# Patient Record
Sex: Male | Born: 1965 | Race: Black or African American | Hispanic: No | Smoking: Never smoker
Health system: Southern US, Community
[De-identification: ages and names within clinical notes are randomized; demographics above are authoritative.]

## PROBLEM LIST (undated history)

## (undated) DIAGNOSIS — K219 Gastro-esophageal reflux disease without esophagitis: Secondary | ICD-10-CM

## (undated) HISTORY — PX: LEG SURGERY: SHX1003

---

## 2017-07-23 ENCOUNTER — Emergency Department: Payer: Medicaid Other

## 2017-07-23 ENCOUNTER — Emergency Department
Admission: EM | Admit: 2017-07-23 | Discharge: 2017-07-24 | Disposition: A | Discharge status: Home / Self Care | Payer: Medicaid Other | Attending: Emergency Medicine | Admitting: Emergency Medicine

## 2017-07-23 ENCOUNTER — Other Ambulatory Visit: Payer: Self-pay

## 2017-07-23 DIAGNOSIS — R1011 Right upper quadrant pain: Secondary | ICD-10-CM | POA: Insufficient documentation

## 2017-07-23 DIAGNOSIS — Z202 Contact with and (suspected) exposure to infections with a predominantly sexual mode of transmission: Secondary | ICD-10-CM | POA: Diagnosis not present

## 2017-07-23 DIAGNOSIS — K2900 Acute gastritis without bleeding: Secondary | ICD-10-CM

## 2017-07-23 DIAGNOSIS — R109 Unspecified abdominal pain: Secondary | ICD-10-CM

## 2017-07-23 DIAGNOSIS — R079 Chest pain, unspecified: Secondary | ICD-10-CM | POA: Diagnosis not present

## 2017-07-23 HISTORY — DX: Gastro-esophageal reflux disease without esophagitis: K21.9

## 2017-07-23 LAB — BASIC METABOLIC PANEL
Anion gap: 11 (ref 5–15)
BUN: 12 mg/dL (ref 6–20)
CALCIUM: 9.2 mg/dL (ref 8.9–10.3)
CHLORIDE: 103 mmol/L (ref 101–111)
CO2: 25 mmol/L (ref 22–32)
CREATININE: 1.43 mg/dL — AB (ref 0.61–1.24)
GFR calc Af Amer: >60 mL/min (ref >60)
GFR calc non Af Amer: 55 mL/min — ABNORMAL LOW (ref >60)
GLUCOSE: 118 mg/dL — AB (ref 65–99)
Potassium: 3.9 mmol/L (ref 3.5–5.1)
Sodium: 139 mmol/L (ref 135–145)

## 2017-07-23 LAB — TROPONIN I: Troponin I: <0.03 ng/mL (ref <0.03)

## 2017-07-23 LAB — CBC
HCT: 49.1 % (ref 40.0–52.0)
HEMOGLOBIN: 16.2 g/dL (ref 13.0–18.0)
MCH: 28.8 pg (ref 26.0–34.0)
MCHC: 33 g/dL (ref 32.0–36.0)
MCV: 87.2 fL (ref 80.0–100.0)
PLATELETS: 187 10*3/uL (ref 150–440)
RBC: 5.63 MIL/uL (ref 4.40–5.90)
RDW: 13.3 % (ref 11.5–14.5)
WBC: 15.1 10*3/uL — ABNORMAL HIGH (ref 3.8–10.6)

## 2017-07-23 LAB — HEPATIC FUNCTION PANEL
ALT: 24 U/L (ref 17–63)
AST: 32 U/L (ref 15–41)
Albumin: 4.3 g/dL (ref 3.5–5.0)
Alkaline Phosphatase: 57 U/L (ref 38–126)
BILIRUBIN TOTAL: 0.6 mg/dL (ref 0.3–1.2)
Total Protein: 7.3 g/dL (ref 6.5–8.1)

## 2017-07-23 LAB — LIPASE, BLOOD: Lipase: 27 U/L (ref 11–51)

## 2017-07-23 MED ORDER — GI COCKTAIL ~~LOC~~
30.0000 mL | Freq: Once | ORAL | Status: AC
  Administered 2017-07-23: 30 mL via ORAL
  Filled 2017-07-23: qty 30

## 2017-07-23 MED ORDER — METRONIDAZOLE 500 MG PO TABS
2000.0000 mg | ORAL_TABLET | Freq: Once | ORAL | Status: AC
  Administered 2017-07-23: 2000 mg via ORAL
  Filled 2017-07-23: qty 4

## 2017-07-23 MED ORDER — ASPIRIN 81 MG PO CHEW
324.0000 mg | CHEWABLE_TABLET | Freq: Once | ORAL | Status: AC
  Administered 2017-07-23: 324 mg via ORAL
  Filled 2017-07-23: qty 4

## 2017-07-23 MED ORDER — AZITHROMYCIN 500 MG PO TABS
1000.0000 mg | ORAL_TABLET | Freq: Once | ORAL | Status: AC
  Administered 2017-07-23: 1000 mg via ORAL
  Filled 2017-07-23: qty 2

## 2017-07-23 NOTE — ED Notes (Signed)
Patient transported to Ultrasound 

## 2017-07-23 NOTE — ED Triage Notes (Signed)
Pt states 2 hours ago started with L sided CP that goes down to his abdomen and around to his back. States nausea but denies vomiting, SOB, diaphoresis. States hx reflux. States he wasn't doing anything when pain started. Alert, oriented.

## 2017-07-23 NOTE — Discharge Instructions (Signed)

## 2017-07-23 NOTE — ED Notes (Signed)

## 2017-07-23 NOTE — ED Notes (Signed)
Pt reports LLQ abdominal pain that started this afternoon. Pt reports it radiates into his chest. 10/10 constant pain. Last BM today and reports he has been feeling constipated. Hx acid reflux and on omeprazole. +nausea with 1 episode of vomiting. Pt also states "I was in a dirty car last week and I think I got bite by a bug on my chest." Assessed chest and 3 small raised red areas to right chest. Pt denies any itching or pain to area, no drainage noted. Pt states "I didn't feel anything bite me or see anything but I think something got me." Pt denies SOB or urinary symptoms. Call bell within reach.

## 2017-07-23 NOTE — ED Provider Notes (Signed)
[facility] Emergency Department Provider Note   ____________________________________________   First MD Initiated Contact with Patient 07/23/17 2132     (approximate)  I have reviewed the triage vital signs and the nursing notes.   HISTORY  Chief Complaint Chest Pain    HPI Eytan Lurz is a 52 y.o. male reports a history of acid reflux  Patient reports he does not have a history of heart problems.  Today he reports that he was at work, he went to [employer] and got a sandwich.  Shortly after eating that he noticed his stomach felt very upset, he began feeling nauseated having pain in his upper abdomen.  He then reports he started expensing a burning and vomited once.  After vomiting is expensive burning feeling in his mid chest.  He actually reports his symptoms have eased off quite a bit now and is feeling better.  The pain was in his upper abdomen and seem to radiate a little bit towards his back but is now gone.  No fevers or chills.  No diarrhea.  Reports he had nausea, after vomiting he felt better was left a burning in the chest.  Pain in the chest does not radiate.  Is not a heaviness or tightness, rather it feels like a asked that he flavor.  He did not take his normal acid reflux medication today because he was busy at work.  No shortness of breath.  He also reports that he had an exposure to a "dirty girl" a week ago who informed him that she had a potential STD.  He reports he is to condom and did not have any symptoms afterwards.  Reports he had gonorrhea in the past and he does not have that.  He only wants treatment in case he could have been exposed to chlamydia, but is very adamant that he would not accept treatment for gonorrhea.   Past Medical History:  Diagnosis Date  . GERD (gastroesophageal reflux disease)     There are no active problems to display for this patient.   Past Surgical History:  Procedure Laterality Date  . LEG  SURGERY      Prior to Admission medications   Not on File    Allergies Patient has no known allergies.  History reviewed. No pertinent family history.  Social History Social History   Tobacco Use  . Smoking status: Never Smoker  Substance Use Topics  . Alcohol use: Not Currently  . Drug use: Not on file    Review of Systems Constitutional: No fever/chills Eyes: No visual changes. ENT: No sore throat. Cardiovascular: See HPI respiratory: Denies shortness of breath. Gastrointestinal:  No diarrhea.  No constipation. Genitourinary: Negative for dysuria.  No discharge.  No abdominal pain lower abdomen. Musculoskeletal: Negative for back pain. Skin: Negative for rash. Neurological: Negative for headaches, focal weakness or numbness.    ____________________________________________   PHYSICAL EXAM:  VITAL SIGNS: ED Triage Vitals  Enc Vitals Group     BP 07/23/17 1822 (!) 160/93     Pulse Rate 07/23/17 1822 60     Resp 07/23/17 1822 18     Temp 07/23/17 1822 97.7 F (36.5 C)     Temp Source 07/23/17 1822 Oral     SpO2 07/23/17 1822 100 %     Weight 07/23/17 1821 225 lb (102.1 kg)     Height 07/23/17 1821 6\' 1"  (1.854 m)     Head Circumference --  Peak Flow --      Pain Score 07/23/17 1820 10     Pain Loc --      Pain Edu? --      Excl. in GC? --     Constitutional: Alert and oriented. Well appearing and in no acute distress. Eyes: Conjunctivae are normal. Head: Atraumatic. Nose: No congestion/rhinnorhea. Mouth/Throat: Mucous membranes are moist. Neck: No stridor.   Cardiovascular: Normal rate, regular rhythm. Grossly normal heart sounds.  Good peripheral circulation. Respiratory: Normal respiratory effort.  No retractions. Lungs CTAB. Gastrointestinal: Soft and nontender except for some very minimal discomfort in the epigastrium without any rebound or guarding.  Patient reports very minimal discomfort.  Reports he felt a whole lot worse earlier but felt  better after he threw up. No distention. Musculoskeletal: No lower extremity tenderness nor edema. Neurologic:  Normal speech and language. No gross focal neurologic deficits are appreciated.  Skin:  Skin is warm, dry and intact. No rash noted. Psychiatric: Mood and affect are normal. Speech and behavior are normal.  ____________________________________________   LABS (all labs ordered are listed, but only abnormal results are displayed)  Labs Reviewed  BASIC METABOLIC PANEL - Abnormal; Notable for the following components:      Result Value   Glucose, Bld 118 (*)    Creatinine, Ser 1.43 (*)    GFR calc non Af Amer 55 (*)    All other components within normal limits  CBC - Abnormal; Notable for the following components:   WBC 15.1 (*)    All other components within normal limits  HEPATIC FUNCTION PANEL - Abnormal; Notable for the following components:   Bilirubin, Direct <0.1 (*)    All other components within normal limits  TROPONIN I  LIPASE, BLOOD  TROPONIN I   ____________________________________________  EKG  Reviewed interval at 1822 Heart rate 80 QRS 99 QTc 420 Normal sinus rhythm, some slight ST abnormality noted in lead II and lead III, also some very minimal biphasic appearance in V5 and V6.  No ST elevation. Suspicious for possible LVH, but does not meet criteria. ____________________________________________  RADIOLOGY    Chest x-ray and ultrasound reviewed by me.  No acute findings. ____________________________________________   PROCEDURES  Procedure(s) performed: None  Procedures  Critical Care performed: No  ____________________________________________   INITIAL IMPRESSION / ASSESSMENT AND PLAN / ED COURSE  Pertinent labs & imaging results that were available during my care of the patient were reviewed by me and considered in my medical decision making (see chart for details).  Presents for evaluation of a burning sensation in the chest,  his symptoms have very atypical of ACS.  EKG is slightly abnormal, but in discussion with him he reports he had EKGs in the past and he reports to me that when they have occurred he is been told her just slightly abnormal as well.  Denies a history of heart disease, and I do not have any baseline EKGs to compare to any reports is not sure what hospital did them.  His first troponin is normal his x-ray is negative.  Reports his symptoms are much better, and his symptoms sound primarily like that of potential gastric distress, consider possible food poisoning, acute gastritis, no evidence of an acute abdomen.  Patient also had STD exposure, discussed with him and highly recommended treatment with Rocephin for treatment of gonorrhea, but he is adamant that he is not needing treatment for gonorrhea and only except treatment for chlamydia and other STDs.  Thus he was not given Rocephin.  ----------------------------------------- 11:40 PM on 07/23/2017 -----------------------------------------  Patient asymptomatic.  Reports no ongoing pain or discomfort.  He feels much better.  Reports after drinking the stuff given here his pain went away.  I suspect at this point no signs of ACS, 2- troponins at a very atypical history.  Suspect likely gastritis.  Patient will continue his reflux medications, start a baby aspirin daily and will follow-up closely with cardiology.  Return precautions and treatment recommendations and follow-up discussed with the patient who is agreeable with the plan.       ____________________________________________   FINAL CLINICAL IMPRESSION(S) / ED DIAGNOSES  Final diagnoses:  Abdominal pain  Chest pain with low risk for cardiac etiology  Acute gastritis without hemorrhage, unspecified gastritis type  Exposure to sexually transmitted disease (STD)      NEW MEDICATIONS STARTED DURING THIS VISIT:  New Prescriptions   No medications on file     Note:  This  document was prepared using Dragon voice recognition software and may include unintentional dictation errors.     Sharyn CreamerQuale, Prithvi Kooi, MD 07/23/17 2340

## 2017-08-23 ENCOUNTER — Emergency Department: Payer: Medicaid Other

## 2017-08-23 ENCOUNTER — Other Ambulatory Visit: Payer: Self-pay

## 2017-08-23 ENCOUNTER — Emergency Department
Admission: EM | Admit: 2017-08-23 | Discharge: 2017-08-23 | Disposition: A | Discharge status: Home / Self Care | Payer: Medicaid Other | Attending: Emergency Medicine | Admitting: Emergency Medicine

## 2017-08-23 DIAGNOSIS — N200 Calculus of kidney: Secondary | ICD-10-CM | POA: Diagnosis not present

## 2017-08-23 DIAGNOSIS — M545 Low back pain: Secondary | ICD-10-CM | POA: Diagnosis present

## 2017-08-23 LAB — BASIC METABOLIC PANEL
ANION GAP: 7 (ref 5–15)
BUN: 16 mg/dL (ref 6–20)
CO2: 28 mmol/L (ref 22–32)
CREATININE: 1.72 mg/dL — AB (ref 0.61–1.24)
Calcium: 8.9 mg/dL (ref 8.9–10.3)
Chloride: 107 mmol/L (ref 98–111)
GFR calc non Af Amer: 44 mL/min — ABNORMAL LOW (ref >60)
GFR, EST AFRICAN AMERICAN: 51 mL/min — AB (ref >60)
GLUCOSE: 101 mg/dL — AB (ref 70–99)
Potassium: 3.8 mmol/L (ref 3.5–5.1)
Sodium: 142 mmol/L (ref 135–145)

## 2017-08-23 LAB — URINALYSIS, COMPLETE (UACMP) WITH MICROSCOPIC
BACTERIA UA: NONE SEEN
Bilirubin Urine: NEGATIVE
Glucose, UA: NEGATIVE mg/dL
Ketones, ur: NEGATIVE mg/dL
Leukocytes, UA: NEGATIVE
Nitrite: NEGATIVE
PROTEIN: 30 mg/dL — AB
RBC / HPF: >50 RBC/hpf — ABNORMAL HIGH (ref 0–5)
Specific Gravity, Urine: 1.021 (ref 1.005–1.030)
pH: 5 (ref 5.0–8.0)

## 2017-08-23 LAB — CBC
HEMATOCRIT: 47.1 % (ref 40.0–52.0)
Hemoglobin: 16 g/dL (ref 13.0–18.0)
MCH: 29.2 pg (ref 26.0–34.0)
MCHC: 33.9 g/dL (ref 32.0–36.0)
MCV: 86.2 fL (ref 80.0–100.0)
PLATELETS: 187 10*3/uL (ref 150–440)
RBC: 5.47 MIL/uL (ref 4.40–5.90)
RDW: 13 % (ref 11.5–14.5)
WBC: 9.5 10*3/uL (ref 3.8–10.6)

## 2017-08-23 LAB — TROPONIN I: Troponin I: <0.03 ng/mL (ref <0.03)

## 2017-08-23 MED ORDER — OXYCODONE-ACETAMINOPHEN 5-325 MG PO TABS: 1.0000 | ORAL_TABLET | ORAL | 0 refills | Status: DC | PRN

## 2017-08-23 MED ORDER — TAMSULOSIN HCL 0.4 MG PO CAPS: 0.4000 mg | ORAL_CAPSULE | Freq: Every day | ORAL | 0 refills | Status: DC

## 2017-08-23 MED ORDER — KETOROLAC TROMETHAMINE 60 MG/2ML IM SOLN
60.0000 mg | Freq: Once | INTRAMUSCULAR | Status: AC
  Administered 2017-08-23: 60 mg via INTRAMUSCULAR
  Filled 2017-08-23: qty 2

## 2017-08-23 NOTE — ED Notes (Signed)
Patient transported to CT 

## 2017-08-23 NOTE — ED Notes (Signed)
ED Provider at bedside. 

## 2017-08-23 NOTE — ED Notes (Signed)
No peripheral IV placed this visit.   Discharge instructions reviewed with patient. Questions fielded by this RN. Patient verbalizes understanding of instructions. Patient discharged home in stable condition per Dr Paduchowski. No acute distress noted at time of discharge.   

## 2017-08-23 NOTE — ED Triage Notes (Signed)
Pt states left mid back pain that wraps around to left chest. Pt denies shob, nausea, dizziness, light headed. Pt states strong sensation of needing to urinate.

## 2017-08-23 NOTE — ED Provider Notes (Signed)
[facility] Emergency Department Provider Note  Time seen: 3:15 AM  I have reviewed the triage vital signs and the nursing notes.   HISTORY  Chief Complaint Back Pain    HPI Juan [name] is a 52 y.o. male with a past medical history of gastric reflux presents to the emergency department for left back pain.  According to the patient for the past 6 or 7 hours she has been experiencing fairly significant 8/10 sharp pain in his left back wrapping around to his abdomen.  Denies any chest pain or shortness of breath.  Denies any nausea or vomiting or diarrhea.  Does state an urge to urinate and thought he saw a pink discoloration to the urine earlier.  No history of kidney stones.  No family history of kidney stones.  Describes his pain as 8/10 currently.   Past Medical History:  Diagnosis Date  . GERD (gastroesophageal reflux disease)     There are no active problems to display for this patient.   Past Surgical History:  Procedure Laterality Date  . LEG SURGERY      Prior to Admission medications   Not on File    No Known Allergies  No family history on file.  Social History Social History   Tobacco Use  . Smoking status: Never Smoker  Substance Use Topics  . Alcohol use: Not Currently  . Drug use: Not on file    Review of Systems Constitutional: Negative for fever. Cardiovascular: Negative for chest pain. Respiratory: Negative for shortness of breath. Gastrointestinal: Negative for abdominal pain, vomiting and diarrhea. Genitourinary: Positive for urinary frequency and possible blood in the urine. Musculoskeletal: Left back pain Skin: Negative for skin complaints  Neurological: Negative for headache All other ROS negative  ____________________________________________   PHYSICAL EXAM:  VITAL SIGNS: ED Triage Vitals  Enc Vitals Group     BP 08/23/17 0141 (!) 157/89     Pulse Rate 08/23/17 0141 72     Resp 08/23/17 0141 16     Temp  08/23/17 0141 97.9 F (36.6 C)     Temp Source 08/23/17 0141 Oral     SpO2 08/23/17 0141 98 %     Weight 08/23/17 0142 215 lb (97.5 kg)     Height 08/23/17 0142 6\' 1"  (1.854 m)     Head Circumference --      Peak Flow --      Pain Score 08/23/17 0141 10     Pain Loc --      Pain Edu? --      Excl. in GC? --    Constitutional: Alert and oriented. Well appearing and in no distress. Eyes: Normal exam ENT   Head: Normocephalic and atraumatic.   Mouth/Throat: Mucous membranes are moist. Cardiovascular: Normal rate, regular rhythm. No murmur Respiratory: Normal respiratory effort without tachypnea nor retractions. Breath sounds are clear Gastrointestinal: Soft and nontender. No distention.  Mild left CVA tenderness. Musculoskeletal: Nontender with normal range of motion in all extremities.  Neurologic:  Normal speech and language. No gross focal neurologic deficits  Skin:  Skin is warm, dry and intact.  Psychiatric: Mood and affect are normal.   ____________________________________________    EKG  EKG reviewed and interpreted by myself shows normal sinus rhythm at 69 bpm with a narrow QRS, normal axis, normal intervals, no concerning ST changes.  ____________________________________________    RADIOLOGY  Chest x-ray negative CT scan shows 7 mm stone in the distal left ureter. ____________________________________________  INITIAL IMPRESSION / ASSESSMENT AND PLAN / ED COURSE  Pertinent labs & imaging results that were available during my care of the patient were reviewed by me and considered in my medical decision making (see chart for details).  Patient presents to the emergency department for left back pain radiating to the front of the abdomen also with urinary frequency.  Differential would include ureterolithiasis, muscular skeletal pain, colitis or diverticulitis, urinary tract infection or pyelonephritis.  Patient's basic lab work is largely at baseline, mild  renal insufficiency although largely unchanged from prior.  Will obtain CT imaging as well as a urinalysis to further evaluate.  Patient agreeable to this plan of care.  CT scan shows 7 mm stone in the distal left ureter.  No white blood cell count elevation.  Slight elevation in baseline creatinine.  Urinalysis does not appear consistent with any urinary tract infection.  We will discharge the patient with prompt urology follow-up.  Patient agreeable to plan of care.  We will place on Percocet and Flomax.  I discussed return precautions for worsening pain, dysuria, or fever. ____________________________________________   FINAL CLINICAL IMPRESSION(S) / ED DIAGNOSES  Left flank pain Kidney stone   Minna Antis, MD 08/23/17 [phone]

## 2017-08-23 NOTE — Discharge Instructions (Signed)
Your work-up shows a large 7 mm kidney stone on the left side.  These follow-up with urology by calling on Monday for the next available appointment.  Please take your medications as prescribed.  Drink plenty of fluids.  Return to the emergency department for any worsening pain, painful urination or fever.

## 2017-08-24 LAB — URINE CULTURE: Culture: NO GROWTH

## 2017-10-08 ENCOUNTER — Encounter: Payer: Self-pay | Admitting: Emergency Medicine

## 2017-10-08 ENCOUNTER — Emergency Department
Admission: EM | Admit: 2017-10-08 | Discharge: 2017-10-08 | Disposition: A | Discharge status: Home / Self Care | Payer: Medicaid Other | Attending: Emergency Medicine | Admitting: Emergency Medicine

## 2017-10-08 DIAGNOSIS — N23 Unspecified renal colic: Secondary | ICD-10-CM | POA: Insufficient documentation

## 2017-10-08 DIAGNOSIS — N289 Disorder of kidney and ureter, unspecified: Secondary | ICD-10-CM

## 2017-10-08 DIAGNOSIS — R109 Unspecified abdominal pain: Secondary | ICD-10-CM | POA: Diagnosis present

## 2017-10-08 LAB — URINALYSIS, COMPLETE (UACMP) WITH MICROSCOPIC
Bacteria, UA: NONE SEEN
Bilirubin Urine: NEGATIVE
GLUCOSE, UA: NEGATIVE mg/dL
Ketones, ur: NEGATIVE mg/dL
Leukocytes, UA: NEGATIVE
NITRITE: NEGATIVE
PROTEIN: NEGATIVE mg/dL
SPECIFIC GRAVITY, URINE: 1.016 (ref 1.005–1.030)
Squamous Epithelial / LPF: NONE SEEN (ref 0–5)
pH: 7 (ref 5.0–8.0)

## 2017-10-08 LAB — BASIC METABOLIC PANEL
Anion gap: 8 (ref 5–15)
BUN: 16 mg/dL (ref 6–20)
CALCIUM: 9 mg/dL (ref 8.9–10.3)
CO2: 27 mmol/L (ref 22–32)
CREATININE: 1.86 mg/dL — AB (ref 0.61–1.24)
Chloride: 106 mmol/L (ref 98–111)
GFR, EST AFRICAN AMERICAN: 46 mL/min — AB (ref >60)
GFR, EST NON AFRICAN AMERICAN: 40 mL/min — AB (ref >60)
Glucose, Bld: 106 mg/dL — ABNORMAL HIGH (ref 70–99)
Potassium: 3.9 mmol/L (ref 3.5–5.1)
SODIUM: 141 mmol/L (ref 135–145)

## 2017-10-08 LAB — CBC
HCT: 47 % (ref 40.0–52.0)
Hemoglobin: 16.1 g/dL (ref 13.0–18.0)
MCH: 29.5 pg (ref 26.0–34.0)
MCHC: 34.3 g/dL (ref 32.0–36.0)
MCV: 86.2 fL (ref 80.0–100.0)
PLATELETS: 188 10*3/uL (ref 150–440)
RBC: 5.45 MIL/uL (ref 4.40–5.90)
RDW: 13.1 % (ref 11.5–14.5)
WBC: 9.1 10*3/uL (ref 3.8–10.6)

## 2017-10-08 MED ORDER — OXYCODONE-ACETAMINOPHEN 5-325 MG PO TABS: 1.0000 | ORAL_TABLET | ORAL | 0 refills | Status: AC | PRN

## 2017-10-08 MED ORDER — SODIUM CHLORIDE 0.9 % IV BOLUS
1000.0000 mL | Freq: Once | INTRAVENOUS | Status: AC
  Administered 2017-10-08: 1000 mL via INTRAVENOUS

## 2017-10-08 MED ORDER — HYDROMORPHONE HCL 1 MG/ML IJ SOLN
0.5000 mg | Freq: Once | INTRAMUSCULAR | Status: AC
  Administered 2017-10-08: 0.5 mg via INTRAVENOUS
  Filled 2017-10-08: qty 1

## 2017-10-08 MED ORDER — OXYCODONE-ACETAMINOPHEN 5-325 MG PO TABS
1.0000 | ORAL_TABLET | Freq: Once | ORAL | Status: AC
  Administered 2017-10-08: 1 via ORAL
  Filled 2017-10-08: qty 1

## 2017-10-08 NOTE — Discharge Instructions (Addendum)
Please drink plenty fluid to stay well-hydrated.  You may Tylenol for mild to moderate pain and Percocet as needed for breakthrough pain.  Do not drive within 8 hours of taking Percocet.  Stop taking Toradol, and do not take any other NSAID medications including Advil, Aleve, ibuprofen or Motrin as your kidney function is slightly more abnormal today and these medications can worsen your kidney function.  Return to the emergency department if you develop severe pain, fever, inability to keep down fluids, or any other symptoms concerning to you.

## 2017-10-08 NOTE — ED Notes (Signed)
NAD noted at time of D/C. Pt denies questions or concerns. Pt ambulatory to the lobby at this time.  

## 2017-10-08 NOTE — ED Provider Notes (Addendum)
[facility] Emergency Department Provider Note  ____________________________________________  Time seen: Approximately 7:57 AM  I have reviewed the triage vital signs and the nursing notes.   HISTORY  Chief Complaint Nephrolithiasis and Flank Pain    HPI Juan [name] is a 52 y.o. male with a known 7 mm left-sided ureteral stone [facility] presenting for left flank pain.  The patient reports "I did not sleep all night."  He describes a pain in the left flank that does not radiate without any associated fevers, nausea or vomiting.  He tried a Toradol at 10 PM which did not help.  Prior, his been successfully treated with oxycodone but he does not have any left.  Past Medical History:  Diagnosis Date  . GERD (gastroesophageal reflux disease)     There are no active problems to display for this patient.   Past Surgical History:  Procedure Laterality Date  . LEG SURGERY      Current Outpatient Rx  . Order #: [id number] Class: Print  . Order #: [id number] Class: Print    Allergies Patient has no known allergies.  No family history on file.  Social History Social History   Tobacco Use  . Smoking status: Never Smoker  Substance Use Topics  . Alcohol use: Not Currently  . Drug use: Not on file    Review of Systems Constitutional: No fever/chills.  No lightheadedness or syncope. Eyes: No visual changes. ENT: No sore throat. No congestion or rhinorrhea. Cardiovascular: Denies chest pain. Denies palpitations. Respiratory: Denies shortness of breath.  No cough. Gastrointestinal: No abdominal pain.  Positive left flank pain.  No nausea, no vomiting.  No diarrhea.  No constipation. Genitourinary: Negative for dysuria.  No hematuria.  No penile or testicular pain. Musculoskeletal: Negative for back pain, except for left flank pain.. Skin: Negative for rash. Neurological: Negative for headaches. No focal numbness,  tingling or weakness.     ____________________________________________   PHYSICAL EXAM:  VITAL SIGNS: ED Triage Vitals  Enc Vitals Group     BP 10/08/17 0738 (!) 145/101     Pulse Rate 10/08/17 0738 68     Resp 10/08/17 0738 20     Temp 10/08/17 0738 97.8 F (36.6 C)     Temp Source 10/08/17 0738 Oral     SpO2 10/08/17 0738 94 %     Weight 10/08/17 0740 215 lb (97.5 kg)     Height 10/08/17 0740 6' (1.829 m)     Head Circumference --      Peak Flow --      Pain Score 10/08/17 0740 10     Pain Loc --      Pain Edu? --      Excl. in GC? --     Constitutional: Alert and oriented. Answers questions appropriately.  Sitting comfortably on the stretcher. Eyes: Conjunctivae are normal.  EOMI. No scleral icterus. Head: Atraumatic. Nose: No congestion/rhinnorhea. Mouth/Throat: Mucous membranes are moist.  Neck: No stridor.  Supple.   Cardiovascular: Normal rate, regular rhythm. No murmurs, rubs or gallops.  Respiratory: Normal respiratory effort.  No accessory muscle use or retractions. Lungs CTAB.  No wheezes, rales or ronchi. Gastrointestinal: Soft, nontender and nondistended.  Positive mild left CVA tenderness to palpation.  No CVA tenderness to palpation on the right.  No guarding or rebound.  No peritoneal signs.  Musculoskeletal: No LE edema. Neurologic:  A&Ox3.  Speech is clear.  Face and smile are symmetric.  EOMI.  Moves all extremities well. Skin:  Skin is warm, dry and intact. No rash noted. Psychiatric: Mood and affect are normal. Speech and behavior are normal.  Normal judgement.  ____________________________________________   LABS (all labs ordered are listed, but only abnormal results are displayed)  Labs Reviewed  CBC  BASIC METABOLIC PANEL  URINALYSIS, COMPLETE (UACMP) WITH MICROSCOPIC   ____________________________________________  EKG  Not indicated ____________________________________________  RADIOLOGY  No results  found.  ____________________________________________   PROCEDURES  Procedure(s) performed: None  Procedures  Critical Care performed: No ____________________________________________   INITIAL IMPRESSION / ASSESSMENT AND PLAN / ED COURSE  Pertinent labs & imaging results that were available during my care of the patient were reviewed by me and considered in my medical decision making (see chart for details).  52 y.o. male with a known left ureteral stone presenting with uncontrolled pain at home.  Overall, the patient is hemodynamically stable and does not have any signs or symptoms of UTI or sepsis but we will get a urinalysis.  In addition I will check his creatinine today.  He is already scheduled for [facility] his [facility] tomorrow [facility], which is a reasonable plan if there are no other abnormalities on his laboratory studies today.  Another etiology for the patient's pain is possible but much less likely; aortic pathology or GI pathology including diverticulitis are considered but very unlikely. We will give him intravenous fluids and pain control, and reevaluate for final disposition.  ____________________________________________  FINAL CLINICAL IMPRESSION(S) / ED DIAGNOSES  Final diagnoses:  Renal colic on left side    Clinical Course as of Oct 23 1533  Wed Oct 08, 2017  [id number] The patient states that his pain has not improved at all with Percocet.  I will give him an IV dose of Dilaudid for pain.  At this time, the patient is going to provide a urine sample.  He does have a mildly increased creatinine at 1.86 from his baseline so I am hesitant to give him additional topical Toradol.  I have explained his creatinine result to him, and will make sure that he is drinking plenty of fluids at home, avoiding Toradol, and will follow up for his regularly scheduled surgery tomorrow.  Plan discharge after urinalysis.   [AN]  [id number] Patient is feeling significantly better at this  time.  I will plan to discharge him home with a prescription for 6 tablets of Percocet to use for breakthrough pain with instructions to use Tylenol for mild to moderate pain.  I have asked him to avoid NSAID medications as his creatinine is slightly bumped today.  He understands return precautions as well as follow-up instructions   [AN]    Clinical Course User Index [AN] Rockne MenghiniNorman, Anne-Caroline, MD      NEW MEDICATIONS STARTED DURING THIS VISIT:  New Prescriptions   No medications on file      Rockne MenghiniNorman, Anne-Caroline, MD 10/08/17 [id number]    Rockne MenghiniNorman, Anne-Caroline, MD 10/23/17 1535

## 2017-10-08 NOTE — ED Notes (Signed)
Pt given urinal at this time to provide UA.

## 2017-10-08 NOTE — ED Triage Notes (Signed)
Pt reports has a kidney stone that is too big to pass and he is scheduled for surgery for it tomorrow but can not take the pain.

## 2017-10-08 NOTE — ED Notes (Signed)
First Nurse Note: Patient scheduled for surgery tomorrow, here with worsening pain.  States he knows he has kidney stones.

## 2018-11-10 IMAGING — CT CT RENAL STONE PROTOCOL
2 of 4 series · 16 of 46 positions shown, 18 images · non-contrast
Comparison: Ultrasound right upper quadrant 07/23/2017

CLINICAL DATA: Left mid back pain.  Urgency to urinate.

EXAM:
CT ABDOMEN AND PELVIS WITHOUT CONTRAST
TECHNIQUE: Multidetector CT imaging of the abdomen and pelvis was performed
following the standard protocol without IV contrast.

[Series 2: stone full standard · axial · 0.77mm/px · z∈[-911,-486]mm · 13 of 93 slices shown, 15 images]
[im 4/93  soft-tissue]
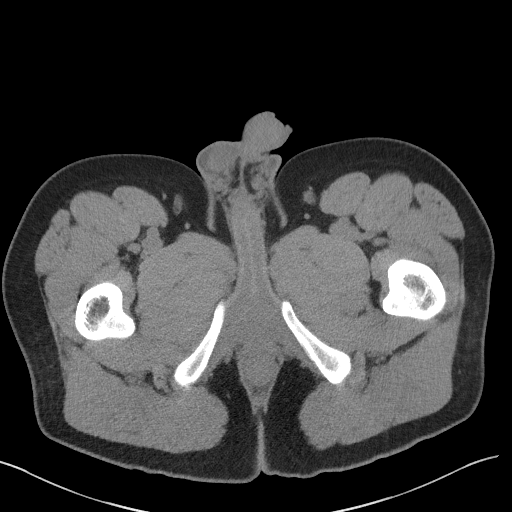
[im 4/93  bone]
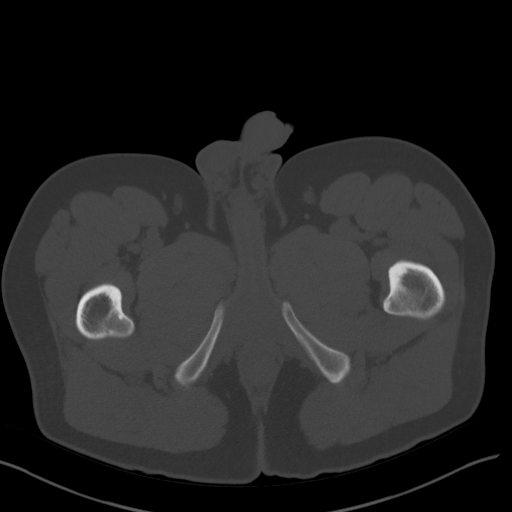
[im 12/93  soft-tissue]
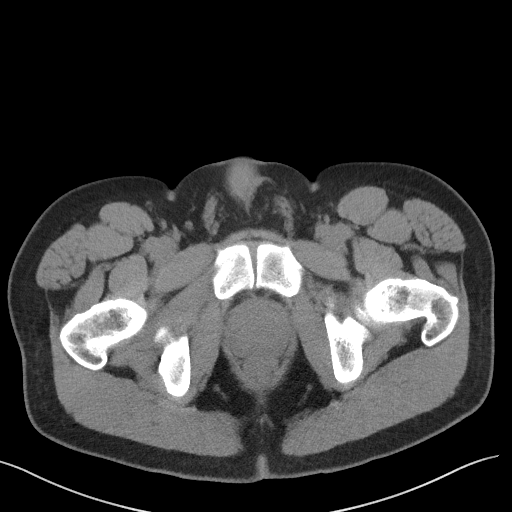
[im 20/93  soft-tissue]
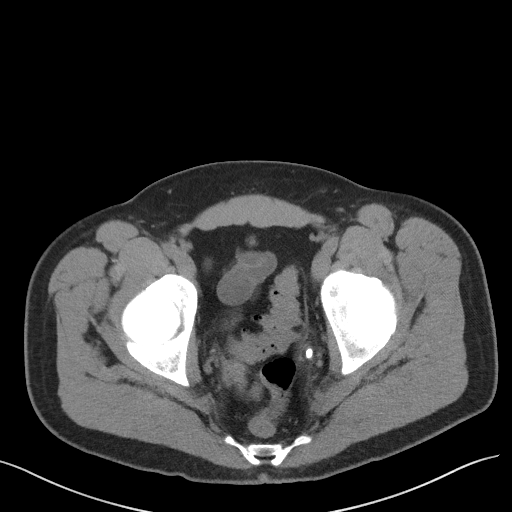
[im 27/93  soft-tissue]
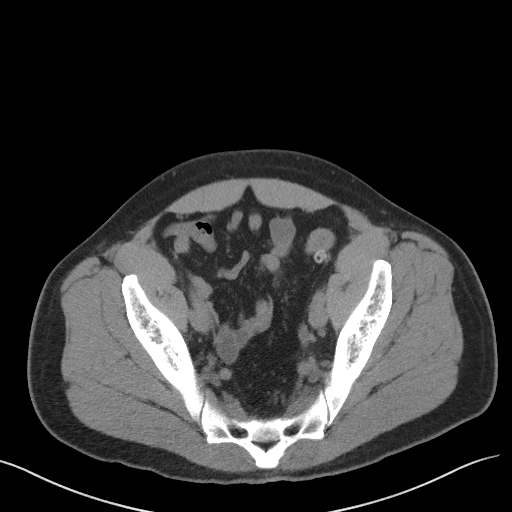
[im 31/93  soft-tissue]
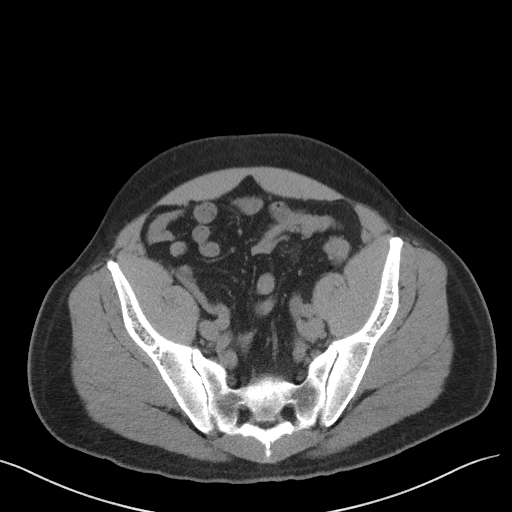
[im 39/93  soft-tissue]
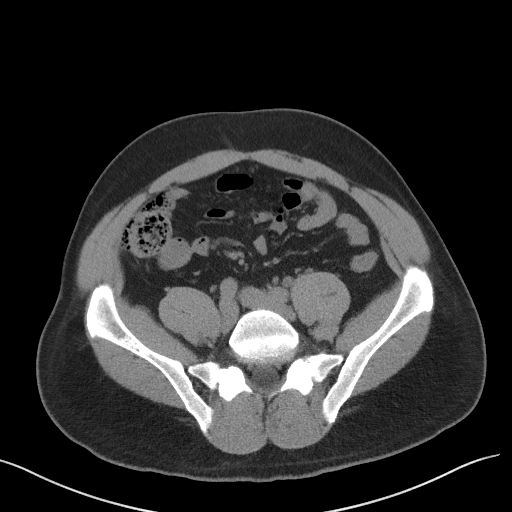
[im 47/93  soft-tissue]
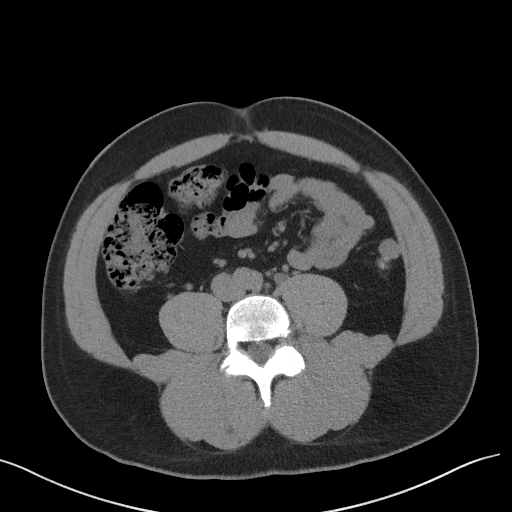
[im 54/93  soft-tissue]
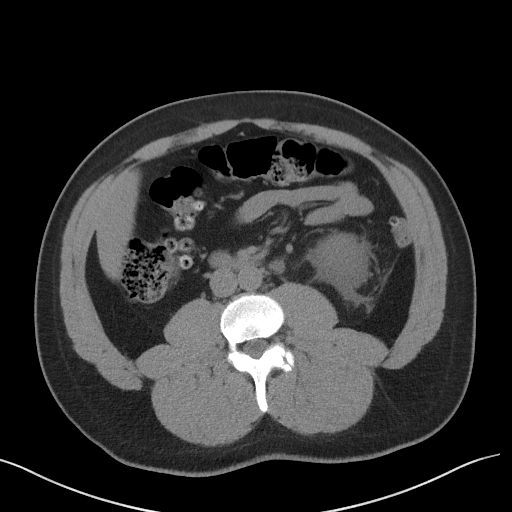
[im 62/93  soft-tissue]
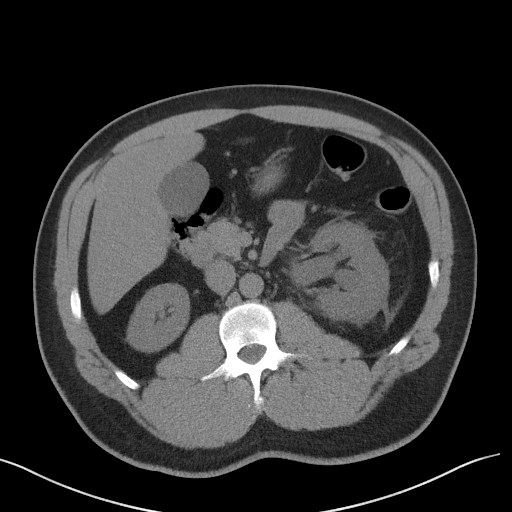
[im 62/93  bone]
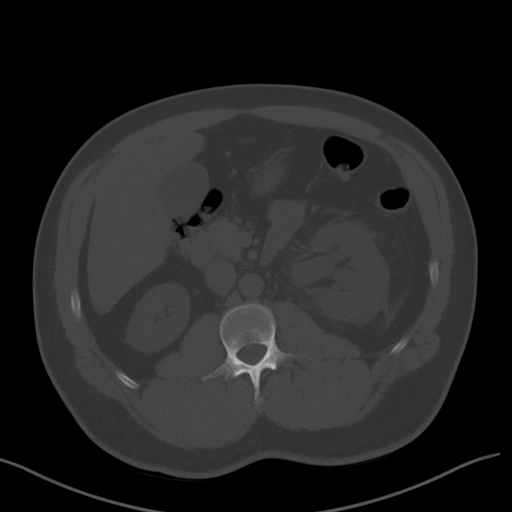
[im 66/93  soft-tissue]
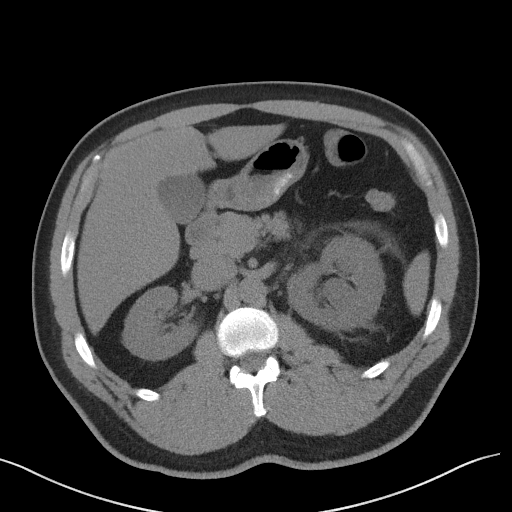
[im 73/93  soft-tissue]
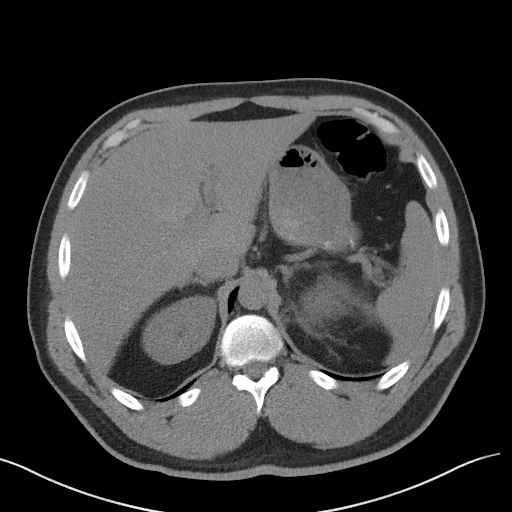
[im 81/93  soft-tissue]
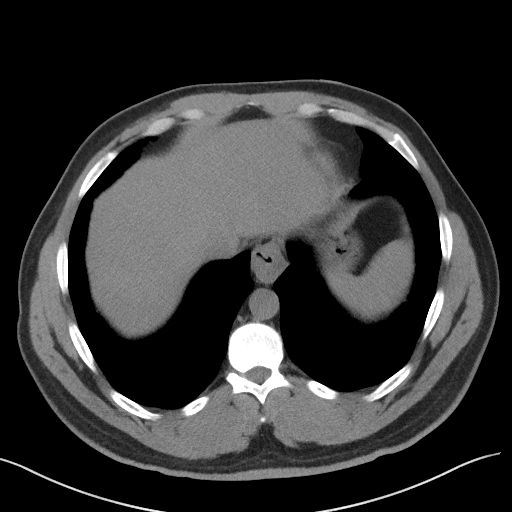
[im 89/93  soft-tissue]
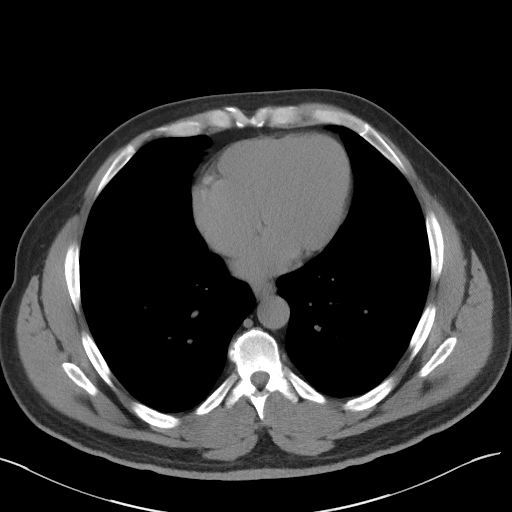

[Series 5: coronal · coronal · 0.74mm/px · 3 of 138 slices shown]
[im 46/138  soft-tissue]
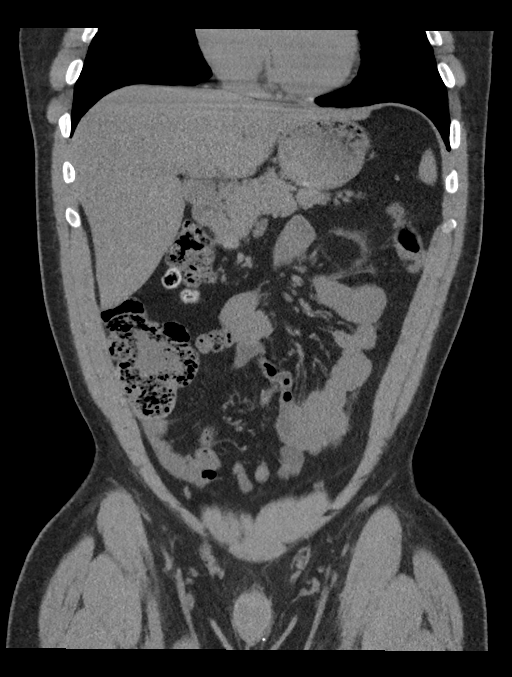
[im 61/138  soft-tissue]
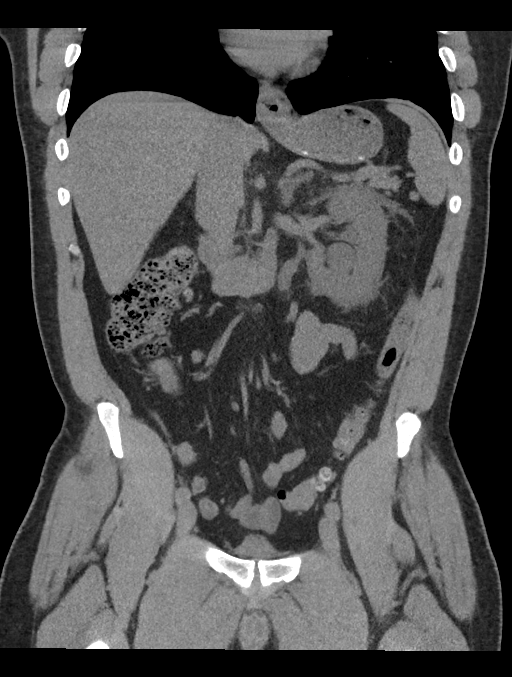
[im 77/138  soft-tissue]
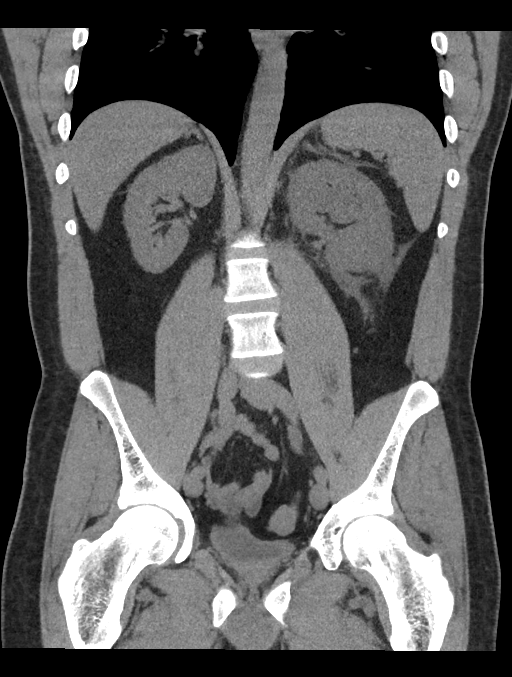

[16 of 46 positions shown; findings below may reference images not displayed]

FINDINGS: Lower chest: Lung bases are clear.  Small esophageal hiatal hernia.

Hepatobiliary: No focal liver abnormality is seen. No gallstones,
gallbladder wall thickening, or biliary dilatation.

Pancreas: Unremarkable. No pancreatic ductal dilatation or
surrounding inflammatory changes.

Spleen: Normal in size without focal abnormality.

Adrenals/Urinary Tract: There is a 7 mm stone in the distal left
ureter at the level of the acetabulum. There is proximal
hydronephrosis and hydroureter with stranding around the left kidney
and ureter. No additional stones are demonstrated. Right kidney and
ureter are decompressed. Bladder wall is not thickened. No bladder
stones identified.

Stomach/Bowel: Diverticulosis of the sigmoid colon without
inflammatory changes. Stomach, small bowel, and colon are not
abnormally distended. No wall thickening is appreciated although
under distention limits examination of the wall. Appendix is normal.

Vascular/Lymphatic: No significant vascular findings are present. No
enlarged abdominal or pelvic lymph nodes.

Reproductive: Prostate gland is enlarged, measuring 4.6 cm in
diameter.

Other: No abdominal wall hernia or abnormality. No abdominopelvic
ascites.

Musculoskeletal: No acute or significant osseous findings.
IMPRESSION: 1. 7 mm stone in the distal left ureter with moderate proximal
obstruction.
2. Enlarged prostate gland.
3. Small esophageal hiatal hernia.
4. Diverticulosis of sigmoid colon without evidence of
diverticulitis.

## 2019-04-09 ENCOUNTER — Ambulatory Visit: Payer: Medicaid Other | Admitting: Internal Medicine

## 2019-04-27 ENCOUNTER — Encounter: Payer: Self-pay | Admitting: Internal Medicine

## 2019-04-27 ENCOUNTER — Other Ambulatory Visit: Payer: Self-pay

## 2019-04-27 ENCOUNTER — Ambulatory Visit: Payer: Medicaid Other | Attending: Internal Medicine | Admitting: Internal Medicine

## 2019-04-27 VITALS — Ht 73.0 in

## 2019-04-27 DIAGNOSIS — M79605 Pain in left leg: Secondary | ICD-10-CM | POA: Insufficient documentation

## 2019-04-27 DIAGNOSIS — S8412XS Injury of peroneal nerve at lower leg level, left leg, sequela: Secondary | ICD-10-CM

## 2019-04-27 DIAGNOSIS — S8412XA Injury of peroneal nerve at lower leg level, left leg, initial encounter: Secondary | ICD-10-CM | POA: Insufficient documentation

## 2019-04-27 DIAGNOSIS — G4489 Other headache syndrome: Secondary | ICD-10-CM | POA: Diagnosis not present

## 2019-04-27 DIAGNOSIS — N529 Male erectile dysfunction, unspecified: Secondary | ICD-10-CM | POA: Diagnosis not present

## 2019-04-27 DIAGNOSIS — Z79891 Long term (current) use of opiate analgesic: Secondary | ICD-10-CM

## 2019-04-27 DIAGNOSIS — K219 Gastro-esophageal reflux disease without esophagitis: Secondary | ICD-10-CM | POA: Diagnosis not present

## 2019-04-27 NOTE — Progress Notes (Signed)
Virtual Visit via Telephone Note Due to current restrictions/limitations of in-office visits due to the COVID-19 pandemic, this scheduled clinical appointment was converted to a telehealth visit. Patient requested tele-visit rather than in person  I connected with Juan [name] on 04/27/19 at 8:39 a.m by telephone and verified that I am speaking with the correct person using two identifiers. I am in my office.  The patient is at home.  Only the patient and myself participated in this encounter.  I discussed the limitations, risks, security and privacy concerns of performing an evaluation and management service by telephone and the availability of in person appointments. I also discussed with the patient that there may be a patient responsible charge related to this service. The patient expressed understanding and agreed to proceed.   History of Present Illness: Pt for new pt visit. Previous PCP was at [facility] in [place].  Last seen 2 mths ago.   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  Pt is on Oxycodone, Cialis 20 mg and Omeprazole.  Main concern today is HA.  Started a 1 mth ago "off and on.  Then I started waking up with a headache."   -frequency:  Once a wk -location: behind the eyes and temple area -Associated symptoms:  No new blurred vision, N/V, dizziness, photophobia.  Last 2 days.  No initiating factors.  Wakes up with HA.  Does not snore.  Wakes up feeling refresh sometimes. No previous hx of HA.  No personal HX of HTN Nothing makes it worse Better with putting hot rag on his head and taking 3 Excedrine pills.  Took 1 hr to kick in.  Reports he would also take one Oxycodone.  -wears reading glasses.  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  He reports chronic nerve pain.  He tells me he has been on oxycodone for the past 2 years.  However in reviewing his  records on [facility], and [facility], this is not the case.  Recently given a prescription in January for 60 tablets by Dr. Sela Hua from [place].  Urine drug screen was negative at that time.  However prior to that last prescription was in 2019 not on a consistent basis.  Informed patient of this information at which point he admitted that he was not on oxycodone consistently for the past 2 years as he had indicated earlier.  Observations/Objective: No direct observation done as this was a telephone encounter.  However I did review some of his records from [facility] through [facility]  Assessment and Plan: 1. Headache syndrome Does not sound typical for migraine or cluster.  Will refer to neurology for further evaluation.  In the meantime I recommend he can continue using the Excedrin since he does get relief with that. - Ambulatory referral to Neurology  2. Injury of left peroneal nerve, sequela Recommend trying him with gabapentin but patient states he was on that in the past and did not find it helpful.  Advised that we do not prescribe oxycodone at our practice.  He is willing to see a pain specialist - Ambulatory referral to Pain Clinic  3. Pain of left lower extremity - Ambulatory referral to Pain Clinic  4. Erectile dysfunction, unspecified erectile dysfunction type On Cialis  5. Gastroesophageal reflux disease without esophagitis On omeprazole   Follow Up Instructions: 2 mths in-person  I discussed the assessment and treatment plan with the patient. The patient was provided an opportunity to ask questions and all were answered. The patient agreed with the plan and demonstrated an understanding of the instructions.   The patient was advised to call back or seek an in-person evaluation if the symptoms worsen or if the condition fails to improve as anticipated.  I provided 15 minutes of non-face-to-face time during this encounter.   Karle Plumber, MD

## 2019-04-27 NOTE — Progress Notes (Signed)
Pt states he is also having pain in his left leg

## 2019-05-03 ENCOUNTER — Encounter: Payer: Self-pay | Admitting: Physical Medicine & Rehabilitation

## 2019-05-18 ENCOUNTER — Other Ambulatory Visit: Payer: Self-pay

## 2019-05-18 ENCOUNTER — Encounter: Payer: Self-pay | Admitting: Physical Medicine & Rehabilitation

## 2019-05-18 ENCOUNTER — Encounter: Payer: Medicaid Other | Attending: Physical Medicine & Rehabilitation | Admitting: Physical Medicine & Rehabilitation

## 2019-05-18 VITALS — Temp 97.7°F | Ht 73.0 in | Wt 225.0 lb

## 2019-05-18 DIAGNOSIS — S8412XA Injury of peroneal nerve at lower leg level, left leg, initial encounter: Secondary | ICD-10-CM | POA: Insufficient documentation

## 2019-05-18 NOTE — Patient Instructions (Addendum)
If urine screen looks ok would do combination therapy Pregabalin plus  tramadol    TENS unit rep will schedule a meeting with you in this office   Please notify me if you'd like to try a new foot brace left side

## 2019-05-18 NOTE — Progress Notes (Signed)
Subjective:    Patient ID: Juan [name], male    DOB: 23-Jun-1965, 54 y.o.   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  Had immediate onset of Left foot drop.  Has had 2 foot surgeries ~22mo after the fall.  Immediate major swelling C/o toes clawing, drags the left foot.  Has been wearing a cam walker boot.  He indicates he had some other type of brace but this was uncomfortable.  Does not see podiatrist.  Had benefit from TENS unit in the past but no longer has a unit. He is independent with all his self-care and mobility. His pain is mainly in the foot but he cannot tell me whether that is the top of the foot or the bottom of the foot.  No pain with movement of the foot but does have pain when weightbearing.  The patient uses a cane to ambulate due to instability No recent falls or trauma to the left foot. The patient has not noticed any pain in his low back or in his upper leg.  No other weakness in his body other than at the left foot and ankle area.  He has had some pain in the left arm but this is not a major complaint.  PDQ 9 score is 4 low Opioid risk score 0, low Pain Inventory Average Pain 9 Pain Right Now 10 My pain is sharp, burning, stabbing, tingling and aching  In the last 24 hours, has pain interfered with the following? General activity 9 Relation with others 9 Enjoyment of life 9 What TIME of day is your pain at its worst? night Sleep (in general) Poor  Pain is worse with: walking and some activites Pain improves with: medication Relief from Meds: 10  Mobility walk with assistance use a cane ability to climb steps?  yes do you drive?  yes  Function disabled: date disabled .  Neuro/Psych numbness tingling trouble walking spasms  Prior Studies new  Physicians involved in your care new   History reviewed. No pertinent family history. Social History   Socioeconomic History  . Marital status: Divorced   Spouse name: Not on file  . Number of children: Not on file  . Years of education: Not on file  . Highest education level: Not on file  Occupational History  . Not on file  Tobacco Use  . Smoking status: Never Smoker  . Smokeless tobacco: Never Used  Substance and Sexual Activity  . Alcohol use: Not Currently  . Drug use: Never  . Sexual activity: Not on file  Other Topics Concern  . Not on file  Social History Narrative  . Not on file   Social Determinants of Health   Financial Resource Strain:   . Difficulty of Paying Living Expenses:   Food Insecurity:   . Worried About Charity fundraiser in the Last Year:   . Arboriculturist in the Last Year:   Transportation Needs:   . Film/video editor (Medical):   Marland Kitchen Lack of Transportation (Non-Medical):   Physical Activity:   . Days of Exercise per Week:   . Minutes of Exercise per Session:   Stress:   . Feeling of Stress :   Social Connections:   . Frequency of Communication with Friends and Family:   . Frequency of Social Gatherings with Friends and Family:   . Attends Religious Services:   . Active Member of Clubs or  Organizations:   . Attends Banker Meetings:   Marland Kitchen Marital Status:    Past Surgical History:  Procedure Laterality Date  . LEG SURGERY     Past Medical History:  Diagnosis Date  . GERD (gastroesophageal reflux disease)    Temp 97.7 F (36.5 C)   Ht 6\' 1"  (1.854 m)   Wt 225 lb (102.1 kg)   BMI 29.69 kg/m   Opioid Risk Score:   Fall Risk Score:  `1  Depression screen PHQ 2/9  Depression screen [facility] 2/9 05/18/2019 04/27/2019  Decreased Interest 1 0  Down, Depressed, Hopeless 0 0  PHQ - 2 Score 1 0  Altered sleeping 1 -  Tired, decreased energy 1 -  Change in appetite 0 -  Feeling bad or failure about yourself  0 -  Trouble concentrating 1 -  Moving slowly or fidgety/restless 0 -  Suicidal thoughts 0 -  PHQ-9 Score 4 -  Difficult doing work/chores Not difficult at all -     Review of Systems  Constitutional: Negative.   HENT: Negative.   Eyes: Negative.   Cardiovascular: Negative.   Gastrointestinal: Negative.   Endocrine: Negative.   Genitourinary: Negative.   Musculoskeletal: Positive for gait problem.  Skin: Negative.   Allergic/Immunologic: Negative.   Neurological: Positive for weakness and numbness.       Tingling   Hematological: Negative.   Psychiatric/Behavioral: Negative.   All other systems reviewed and are negative.      Objective:   Physical Exam Vitals and nursing note reviewed.  Constitutional:      Appearance: Normal appearance.  HENT:     Head: Normocephalic and atraumatic.  Eyes:     Extraocular Movements: Extraocular movements intact.     Conjunctiva/sclera: Conjunctivae normal.     Pupils: Pupils are equal, round, and reactive to light.  Cardiovascular:     Rate and Rhythm: Normal rate and regular rhythm.     Pulses:          Dorsalis pedis pulses are 2+ on the right side and 0 on the left side.       Posterior tibial pulses are 2+ on the right side and 2+ on the left side.     Heart sounds: Normal heart sounds. No murmur.  Pulmonary:     Effort: Pulmonary effort is normal. No respiratory distress.     Breath sounds: Normal breath sounds. No stridor.  Abdominal:     General: Abdomen is flat. Bowel sounds are normal. There is no distension.     Palpations: Abdomen is soft. There is no mass.  Musculoskeletal:        General: No swelling.     Cervical back: Normal range of motion.     Comments: Left ankle contracture. Atrophy of the left calf muscle.  Skin:    General: Skin is warm and dry.     Comments: No lesions on the left foot there is a healed surgical incision left anterior ankle medial aspect without tenderness or erythema  Neurological:     General: No focal deficit present.     Mental Status: He is alert and oriented to person, place, and time. Mental status is at baseline.     Comments: Motor  strength is 5/5 bilateral deltoid bicep tricep grip right hip flexor knee extensor ankle dorsiflexor, left hip flexor knee extensor 0/5 in the left ankle dorsiflexor and left great toe extensor 3/5 in the left ankle plantar flexor Sensation is absent  to light touch in the left great toe partial sensation in the left little toe.  Psychiatric:        Mood and Affect: Mood normal.        Behavior: Behavior normal.   Absent eversion left side Ambulates with Cam walker boot he does have some compensatory hip hiking. L calf atrophy      Assessment & Plan:  #1.  Chronic left common peroneal nerve injury.  He has chronic left foot drop, gait abnormality, chronic neuropathic pain. He would benefit from an AFO but is reluctant based on a previous experience with some type of brace that was uncomfortable for him.  We discussed that there are new designs that may be more comfortable for him.  He will hold off on this for now  Patient had partial relief with TENS unit we will set him up for an evaluation with the EMS I rep  We discussed physical therapy may be helpful for some of his gait abnormalities he does have a compensatory gait pattern with hip hiking.  Unfortunately it is unlikely that Medicaid will pay for more than a couple visits given that this is a chronic issue and this would not be enough to help with a longstanding habitual gait pattern.  In terms of medication management he will likely require a controlled substance but will minimize use of schedule II medicines if possible.  Will check urine drug screen and if this looks okay we may start tramadol 50 mg twice daily with Lyrica 75 mg twice daily.  Recheck with nurse practitioner in 1 month and may need to titrate upward.

## 2019-05-20 ENCOUNTER — Telehealth: Payer: Self-pay | Admitting: *Deleted

## 2019-05-20 NOTE — Telephone Encounter (Signed)
Juan [name] left a message stating that he did not get his medication.  In reading the note, he would receive something if his UDS is appropriate.  This was done on 05/18/19 so result may not be available until next week. I have left a message for Juan [name] to call us so that this may be explained to Juan [name].

## 2019-05-20 NOTE — Telephone Encounter (Signed)
Juan [name] called back and I explained to him about the urine results are required before we can prescribe. He understands.

## 2019-05-21 LAB — TOXASSURE SELECT,+ANTIDEPR,UR

## 2019-05-24 ENCOUNTER — Telehealth: Payer: Self-pay | Admitting: *Deleted

## 2019-05-24 MED ORDER — TRAMADOL HCL 50 MG PO TABS: 50.0000 mg | ORAL_TABLET | Freq: Four times a day (QID) | ORAL | 1 refills | Status: DC | PRN

## 2019-05-24 MED ORDER — PREGABALIN 75 MG PO CAPS: 75.0000 mg | ORAL_CAPSULE | Freq: Two times a day (BID) | ORAL | 1 refills | Status: DC

## 2019-05-24 NOTE — Telephone Encounter (Signed)
Urine drug screen is consistent with having no controlled or illegal medication present. Per Dr Wynn Banker office visit note, "Will check urine drug screen and if this looks okay we may start tramadol 50 mg twice daily with Lyrica 75 mg twice daily.  "

## 2019-05-31 NOTE — Progress Notes (Deleted)
[facility]    Provider:  Dr Lucia Gaskins Requesting Provider: Marcine Matar, MD Primary Care Provider:  Marcine Matar, MD  CC:  ***  HPI:  Juan [name] is a 54 y.o. male here as requested by Marcine Matar, MD for headache syndrome.  I reviewed Dr. Henriette Combs notes, when she saw him in March it was a new patient visit, previous PCP was at [facility] in [place], he has a history of foot drop in the left leg from a work-related injury in 2002 with chronic pain, nephrolithiasis, erectile dysfunction and GERD.  However he is main concern was headache which started a month prior to exam off and on, waking up with headaches; he has them once a week, behind the eyes and temple area, no new vision changes, nausea vomiting, dizziness, photophobia, other initiating factors.  He did state he wakes up with a headache, does not snore, wakes up feeling refreshed, nothing makes it worse, better with 3 Excedrin.  Dr. Laural Benes thought that this sounded more typical for migraine or cluster and referred to neurology.  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  On Cialis for ED(*** check if these are the mornings he had headaches).  Reviewed notes, labs and imaging from outside physicians, which showed ***  Review of Systems: Patient complains of symptoms per HPI as well as the following symptoms ***. Pertinent negatives and positives per HPI. All others negative.   Social History   Socioeconomic History  . Marital status: Divorced    Spouse name: Not on file  . Number of children: Not on file  . Years of education: Not on file  . Highest education level: Not on file  Occupational History  . Not on file  Tobacco Use  . Smoking status: Never Smoker  . Smokeless tobacco: Never Used  Substance and Sexual Activity  . Alcohol use: Not Currently  . Drug use: Never  . Sexual activity: Not on file  Other Topics Concern  .  Not on file  Social History Narrative  . Not on file   Social Determinants of Health   Financial Resource Strain:   . Difficulty of Paying Living Expenses:   Food Insecurity:   . Worried About Programme researcher, broadcasting/film/video in the Last Year:   . Barista in the Last Year:   Transportation Needs:   . Freight forwarder (Medical):   Marland Kitchen Lack of Transportation (Non-Medical):   Physical Activity:   . Days of Exercise per Week:   . Minutes of Exercise per Session:   Stress:   . Feeling of Stress :   Social Connections:   . Frequency of Communication with Friends and Family:   . Frequency of Social Gatherings with Friends and Family:   . Attends Religious Services:   . Active Member of Clubs or Organizations:   . Attends Banker Meetings:   Marland Kitchen Marital Status:   Intimate Partner Violence:   . Fear of Current or Ex-Partner:   . Emotionally Abused:   Marland Kitchen Physically Abused:   . Sexually Abused:     No family history on file.  Past Medical History:  Diagnosis Date  . GERD (gastroesophageal reflux disease)     Patient Active Problem List   Diagnosis Date Noted  . Erectile dysfunction 04/27/2019  . Gastroesophageal reflux disease without esophagitis 04/27/2019  . Pain of left lower extremity 04/27/2019  . Left  peroneal nerve injury 04/27/2019  . Headache syndrome 04/27/2019    Past Surgical History:  Procedure Laterality Date  . LEG SURGERY      Current Outpatient Medications  Medication Sig Dispense Refill  . omeprazole (PRILOSEC) 40 MG capsule Take 40 mg by mouth daily.    Marland Kitchen oxyCODONE-acetaminophen (PERCOCET) 10-325 MG tablet Take 1 tablet by mouth every 4 (four) hours as needed for pain.    . pregabalin (LYRICA) 75 MG capsule Take 1 capsule (75 mg total) by mouth 2 (two) times daily. 60 capsule 1  . tamsulosin (FLOMAX) 0.4 MG CAPS capsule Take 1 capsule (0.4 mg total) by mouth daily. 30 capsule 0  . traMADol (ULTRAM) 50 MG tablet Take 1 tablet (50 mg total)  by mouth every 6 (six) hours as needed. 60 tablet 1   No current facility-administered medications for this visit.    Allergies as of 06/01/2019  . (No Known Allergies)    Vitals: There were no vitals taken for this visit. Last Weight:  Wt Readings from Last 1 Encounters:  05/18/19 225 lb (102.1 kg)   Last Height:   Ht Readings from Last 1 Encounters:  05/18/19 6\' 1"  (1.854 m)     Physical exam: Exam: Gen: NAD, conversant, well nourised, obese, well groomed                     CV: RRR, no MRG. No Carotid Bruits. No peripheral edema, warm, nontender Eyes: Conjunctivae clear without exudates or hemorrhage  Neuro: Detailed Neurologic Exam  Speech:    Speech is normal; fluent and spontaneous with normal comprehension.  Cognition:    The patient is oriented to person, place, and time;     recent and remote memory intact;     language fluent;     normal attention, concentration,     fund of knowledge Cranial Nerves:    The pupils are equal, round, and reactive to light. The fundi are normal and spontaneous venous pulsations are present. Visual fields are full to finger confrontation. Extraocular movements are intact. Trigeminal sensation is intact and the muscles of mastication are normal. The face is symmetric. The palate elevates in the midline. Hearing intact. Voice is normal. Shoulder shrug is normal. The tongue has normal motion without fasciculations.   Coordination:    Normal finger to nose and heel to shin. Normal rapid alternating movements.   Gait:    Heel-toe and tandem gait are normal.   Motor Observation:    No asymmetry, no atrophy, and no involuntary movements noted. Tone:    Normal muscle tone.    Posture:    Posture is normal. normal erect    Strength:    Strength is V/V in the upper and lower limbs.      Sensation: intact to LT     Reflex Exam:  DTR's:    Deep tendon reflexes in the upper and lower extremities are normal bilaterally.    Toes:    The toes are downgoing bilaterally.   Clonus:    Clonus is absent.    Assessment/Plan:    No orders of the defined types were placed in this encounter.  No orders of the defined types were placed in this encounter.   Cc: Ladell Pier, MD,  Ladell Pier, MD  Sarina Ill, MD  [facility] [address] [place], [place] [address]  Phone [phone] Fax [phone]

## 2019-06-01 ENCOUNTER — Ambulatory Visit: Payer: Medicaid Other | Admitting: Neurology

## 2019-06-01 ENCOUNTER — Encounter: Payer: Self-pay | Admitting: Neurology

## 2019-06-15 ENCOUNTER — Encounter: Payer: Medicaid Other | Attending: Physical Medicine & Rehabilitation | Admitting: Registered Nurse

## 2019-06-15 ENCOUNTER — Encounter: Payer: Self-pay | Admitting: Registered Nurse

## 2019-06-15 ENCOUNTER — Other Ambulatory Visit: Payer: Self-pay

## 2019-06-15 VITALS — BP 151/88 | HR 60 | Ht 73.0 in | Wt 222.0 lb

## 2019-06-15 DIAGNOSIS — Z5181 Encounter for therapeutic drug level monitoring: Secondary | ICD-10-CM

## 2019-06-15 DIAGNOSIS — M79605 Pain in left leg: Secondary | ICD-10-CM | POA: Diagnosis not present

## 2019-06-15 DIAGNOSIS — G894 Chronic pain syndrome: Secondary | ICD-10-CM | POA: Diagnosis not present

## 2019-06-15 DIAGNOSIS — S8412XA Injury of peroneal nerve at lower leg level, left leg, initial encounter: Secondary | ICD-10-CM | POA: Insufficient documentation

## 2019-06-15 DIAGNOSIS — Z79899 Other long term (current) drug therapy: Secondary | ICD-10-CM

## 2019-06-15 MED ORDER — TRAMADOL HCL 50 MG PO TABS: 50.0000 mg | ORAL_TABLET | Freq: Three times a day (TID) | ORAL | 0 refills | Status: DC | PRN

## 2019-06-15 NOTE — Patient Instructions (Signed)
Tramadol increased to every 8 hours as needed for pain:   Please call office in two weeks to Evaluate Medication Change:   [phone]

## 2019-06-15 NOTE — Progress Notes (Signed)
Subjective:    Patient ID: Juan [name], male    DOB: 09/20/1965, 54 y.o.   MRN: [id number]  HPI: Juan [name] is a 54 y.o. male who returns for follow up appointment for chronic pain and medication refill. He states his pain is located in his left lower extremity and left foot with tingling and burning. Mr. Limones reports his pain is not being controlled with current medication regimen, we will increase his Tramadol frequency he was instructed to keep pain journal and call office in two weeks to evaluate medication change. He verbalizes understanding.  He  rates his pain 8. His  current exercise regime is walking and performing stretching exercises.  Mr. Bazen Morphine equivalent is 20.00 MME.    Last UDS was Performed on 05/18/2019, it was consistent.    Pain Inventory Average Pain 8 Pain Right Now 8 My pain is sharp, burning and stabbing  In the last 24 hours, has pain interfered with the following? General activity 0 Relation with others 0 Enjoyment of life 0 What TIME of day is your pain at its worst? night Sleep (in general) Poor  Pain is worse with: inactivity Pain improves with: medication Relief from Meds: 10  Mobility walk with assistance use a cane ability to climb steps?  yes do you drive?  yes  Function disabled: date disabled .  Neuro/Psych numbness tingling spasms  Prior Studies Any changes since last visit?  no  Physicians involved in your care Any changes since last visit?  no   History reviewed. No pertinent family history. Social History   Socioeconomic History  . Marital status: Divorced    Spouse name: Not on file  . Number of children: Not on file  . Years of education: Not on file  . Highest education level: Not on file  Occupational History  . Not on file  Tobacco Use  . Smoking status: Never Smoker  . Smokeless tobacco: Never Used  Substance and Sexual Activity  . Alcohol use: Not Currently  . Drug use: Never  . Sexual  activity: Not on file  Other Topics Concern  . Not on file  Social History Narrative  . Not on file   Social Determinants of Health   Financial Resource Strain:   . Difficulty of Paying Living Expenses:   Food Insecurity:   . Worried About Charity fundraiser in the Last Year:   . Arboriculturist in the Last Year:   Transportation Needs:   . Film/video editor (Medical):   Marland Kitchen Lack of Transportation (Non-Medical):   Physical Activity:   . Days of Exercise per Week:   . Minutes of Exercise per Session:   Stress:   . Feeling of Stress :   Social Connections:   . Frequency of Communication with Friends and Family:   . Frequency of Social Gatherings with Friends and Family:   . Attends Religious Services:   . Active Member of Clubs or Organizations:   . Attends Archivist Meetings:   Marland Kitchen Marital Status:    Past Surgical History:  Procedure Laterality Date  . LEG SURGERY     Past Medical History:  Diagnosis Date  . GERD (gastroesophageal reflux disease)    BP (!) 151/88   Pulse 60   Ht 6\' 1"  (1.854 m)   Wt 222 lb (100.7 kg)   SpO2 98%   BMI 29.29 kg/m   Opioid Risk Score:   Fall Risk Score:  `1  Depression screen PHQ 2/9  Depression screen [facility] 2/9 05/18/2019 04/27/2019  Decreased Interest 1 0  Down, Depressed, Hopeless 0 0  PHQ - 2 Score 1 0  Altered sleeping 1 -  Tired, decreased energy 1 -  Change in appetite 0 -  Feeling bad or failure about yourself  0 -  Trouble concentrating 1 -  Moving slowly or fidgety/restless 0 -  Suicidal thoughts 0 -  PHQ-9 Score 4 -  Difficult doing work/chores Not difficult at all -    Review of Systems  Constitutional: Negative.   HENT: Negative.   Eyes: Negative.   Respiratory: Negative.   Cardiovascular: Negative.   Gastrointestinal: Negative.   Endocrine: Negative.   Genitourinary: Negative.   Musculoskeletal: Positive for gait problem.       Spasms  Skin: Negative.   Allergic/Immunologic: Negative.     Neurological: Positive for numbness.       Tingling   Hematological: Negative.   Psychiatric/Behavioral: Negative.   All other systems reviewed and are negative.      Objective:   Physical Exam        Assessment & Plan:  1. Chronic Left Common Peroneal Nerve Injury: He has Chronic left foot drop, gait abnormality and chronic neuropathic Pain: Continue Lyrica.  Refill: Increase:  Tramadol 50 mg TID as needed for pain #90.  We will continue the opioid monitoring program, this consists of regular clinic visits, examinations, urine drug screen, pill counts as well as use of [place] Controlled Substance Reporting system. Call office in two weeks to evaluate medication change and keep pain journal. He verbalizes understanding.   15 minutes of face to face patient care time was spent during this visit. All questions were encouraged and answered.  F/U in 1 month

## 2019-06-24 ENCOUNTER — Telehealth: Payer: Self-pay | Admitting: *Deleted

## 2019-06-24 NOTE — Telephone Encounter (Signed)
Requests a call back. Did not say what he needed. No answer when I tried to call him.

## 2019-06-25 MED ORDER — PREGABALIN 100 MG PO CAPS: 100.0000 mg | ORAL_CAPSULE | Freq: Two times a day (BID) | ORAL | 0 refills | Status: DC

## 2019-06-25 NOTE — Telephone Encounter (Addendum)
Mr Hoctor called again this morning, and this time he left message that  He needed to tell us that the medication precribed is not working. Please advise.

## 2019-06-25 NOTE — Addendum Note (Signed)
Addended by: Erick Colace on: 06/25/2019 02:02 PM   Modules accepted: Orders

## 2019-06-25 NOTE — Telephone Encounter (Signed)
Per Dr Wynn Banker: I placed an order to increase Lyrica to 100 mg twice daily. The patient will need to follow-up with [name] this month   I left the information on his VM per DPR instructions.

## 2019-06-25 NOTE — Telephone Encounter (Signed)
Sybil RN, sent message to Dr Wynn Banker.

## 2019-07-12 ENCOUNTER — Other Ambulatory Visit: Payer: Self-pay

## 2019-07-12 ENCOUNTER — Encounter: Payer: Medicaid Other | Attending: Physical Medicine & Rehabilitation | Admitting: Registered Nurse

## 2019-07-12 ENCOUNTER — Encounter: Payer: Self-pay | Admitting: Registered Nurse

## 2019-07-12 VITALS — BP 136/74 | HR 70 | Temp 97.7°F | Ht 72.0 in | Wt 222.0 lb

## 2019-07-12 DIAGNOSIS — M79605 Pain in left leg: Secondary | ICD-10-CM | POA: Diagnosis not present

## 2019-07-12 DIAGNOSIS — G894 Chronic pain syndrome: Secondary | ICD-10-CM | POA: Diagnosis not present

## 2019-07-12 DIAGNOSIS — Z5181 Encounter for therapeutic drug level monitoring: Secondary | ICD-10-CM | POA: Diagnosis not present

## 2019-07-12 DIAGNOSIS — S8412XA Injury of peroneal nerve at lower leg level, left leg, initial encounter: Secondary | ICD-10-CM | POA: Diagnosis not present

## 2019-07-12 DIAGNOSIS — Z79899 Other long term (current) drug therapy: Secondary | ICD-10-CM

## 2019-07-12 MED ORDER — ACETAMINOPHEN-CODEINE #3 300-30 MG PO TABS: 1.0000 | ORAL_TABLET | Freq: Three times a day (TID) | ORAL | 2 refills | Status: DC | PRN

## 2019-07-12 NOTE — Patient Instructions (Signed)
Medication was changed today: Call office or send a my-chart message in two weeks to evaluate medication change  [phone]

## 2019-07-12 NOTE — Progress Notes (Signed)
Subjective:    Patient ID: Juan [name], male    DOB: 1965/05/21, 54 y.o.   MRN: [id number]  HPI: Juan [name] is a 54 y.o. male who returns for follow up appointment for chronic pain and medication refill. He states his pain is located in his left leg radiating into his left foot. Juan [name] reports his pain is not being controlled with current medication regimen. He was instructed to keep pain log and his medication was changed to Tylenol #3, he verbalizes understanding. He rates his pain 8. His current exercise regime is walking.  Juan [name] Morphine equivalent is 15.00 MME.  Last UDS was Performed on 05/18/2019, it was consistent.    Pain Inventory Average Pain 8 Pain Right Now 8 My pain is sharp and stabbing  In the last 24 hours, has pain interfered with the following? General activity 8 Relation with others 8 Enjoyment of life 8 What TIME of day is your pain at its worst? night Sleep (in general) Poor  Pain is worse with: walking and sitting Pain improves with: heat/ice and medication Relief from Meds: 0  Mobility walk with assistance use a cane ability to climb steps?  yes do you drive?  yes  Function disabled: date disabled .  Neuro/Psych weakness numbness tingling trouble walking  Prior Studies Any changes since last visit?  no  Physicians involved in your care Any changes since last visit?  no   History reviewed. No pertinent family history. Social History   Socioeconomic History  . Marital status: Divorced    Spouse name: Not on file  . Number of children: Not on file  . Years of education: Not on file  . Highest education level: Not on file  Occupational History  . Not on file  Tobacco Use  . Smoking status: Never Smoker  . Smokeless tobacco: Never Used  Substance and Sexual Activity  . Alcohol use: Not Currently  . Drug use: Never  . Sexual activity: Not on file  Other Topics Concern  . Not on file  Social History Narrative  . Not on  file   Social Determinants of Health   Financial Resource Strain:   . Difficulty of Paying Living Expenses:   Food Insecurity:   . Worried About Programme researcher, broadcasting/film/video in the Last Year:   . Barista in the Last Year:   Transportation Needs:   . Freight forwarder (Medical):   Marland Kitchen Lack of Transportation (Non-Medical):   Physical Activity:   . Days of Exercise per Week:   . Minutes of Exercise per Session:   Stress:   . Feeling of Stress :   Social Connections:   . Frequency of Communication with Friends and Family:   . Frequency of Social Gatherings with Friends and Family:   . Attends Religious Services:   . Active Member of Clubs or Organizations:   . Attends Banker Meetings:   Marland Kitchen Marital Status:    Past Surgical History:  Procedure Laterality Date  . LEG SURGERY     Past Medical History:  Diagnosis Date  . GERD (gastroesophageal reflux disease)    BP 136/74   Pulse (!) 54   Temp 97.7 F (36.5 C)   Ht 6' (1.829 m)   Wt 222 lb (100.7 kg)   SpO2 97%   BMI 30.11 kg/m   Opioid Risk Score:   Fall Risk Score:  `1  Depression screen PHQ 2/9  Depression screen PHQ  2/9 05/18/2019 04/27/2019  Decreased Interest 1 0  Down, Depressed, Hopeless 0 0  PHQ - 2 Score 1 0  Altered sleeping 1 -  Tired, decreased energy 1 -  Change in appetite 0 -  Feeling bad or failure about yourself  0 -  Trouble concentrating 1 -  Moving slowly or fidgety/restless 0 -  Suicidal thoughts 0 -  PHQ-9 Score 4 -  Difficult doing work/chores Not difficult at all -    Review of Systems  Constitutional: Negative.   HENT: Negative.   Eyes: Negative.   Respiratory: Negative.   Cardiovascular: Negative.   Gastrointestinal: Negative.   Endocrine: Negative.   Genitourinary: Negative.   Musculoskeletal: Positive for gait problem.  Allergic/Immunologic: Negative.   Neurological: Positive for weakness and numbness.       Tingling   Hematological: Negative.     Psychiatric/Behavioral: Negative.   All other systems reviewed and are negative.      Objective:   Physical Exam Vitals and nursing note reviewed.  Constitutional:      Appearance: Normal appearance.  Cardiovascular:     Rate and Rhythm: Normal rate and regular rhythm.     Pulses: Normal pulses.     Heart sounds: Normal heart sounds.  Pulmonary:     Effort: Pulmonary effort is normal.     Breath sounds: Normal breath sounds.  Musculoskeletal:     Cervical back: Normal range of motion and neck supple.     Comments: Normal Muscle Bulk and Muscle Testing Reveals:  Upper Extremities: Full ROM and Muscle Strength 5/5  Lower Extremities: Right: Full ROM and Muscle Strength 5/5 Left: Decreased ROM and Muscle Strength 4/5 with Muscle Waisting Left Lower Extremity Flexion Produces Pain into his Left Foot Arises from Table with ease Narrow Based Gait   Skin:    General: Skin is warm and dry.  Neurological:     Mental Status: He is alert and oriented to person, place, and time.  Psychiatric:        Mood and Affect: Mood normal.        Behavior: Behavior normal.           Assessment & Plan:  1. Chronic Left Common Peroneal Nerve Injury: He has Chronic left foot drop, gait abnormality and chronic neuropathic Pain: Continue Lyrica.  RX: Tylenol # 3 TID as needed for pain #90.  We will continue the opioid monitoring program, this consists of regular clinic visits, examinations, urine drug screen, pill counts as well as use of [place] Controlled Substance Reporting system. Call office in two weeks to evaluate medication change and keep pain journal. He verbalizes understanding.   15 minutes of face to face patient care time was spent during this visit. All questions were encouraged and answered.  F/U in 1 month

## 2019-08-10 ENCOUNTER — Telehealth: Payer: Self-pay

## 2019-08-10 MED ORDER — HYDROCODONE-ACETAMINOPHEN 5-325 MG PO TABS: 1.0000 | ORAL_TABLET | Freq: Three times a day (TID) | ORAL | 0 refills | Status: DC | PRN

## 2019-08-10 NOTE — Telephone Encounter (Signed)
Mr. Capri called to report that the Tylenol #3 are not working.

## 2019-08-10 NOTE — Telephone Encounter (Signed)
Return Juan [name], he reports no relief in his pain with Tylenol #3. Tylenol #3 discontinued and Hydrocodone e- scribed today. He was instructed to [name] office  On Monday 08/15/2019, for medication evaluation. He verbalizes understanding.

## 2019-09-20 ENCOUNTER — Telehealth: Payer: Self-pay | Admitting: Registered Nurse

## 2019-09-20 NOTE — Telephone Encounter (Signed)
Has some questions for [facility] regarding medication and possibly switching to another one.

## 2019-09-21 MED ORDER — HYDROCODONE-ACETAMINOPHEN 7.5-325 MG PO TABS: 1.0000 | ORAL_TABLET | Freq: Three times a day (TID) | ORAL | 0 refills | Status: DC | PRN

## 2019-09-21 NOTE — Telephone Encounter (Signed)
error 

## 2019-09-21 NOTE — Telephone Encounter (Signed)
Return Juan [name] call he states he's receiving only 2-3 hours of relief of his pain in his left leg. We will increase his Hydrocodone to 7.5 mg one tablet three times a day as needed for pain. He verbalizes understanding. He was instructed to keep a pain journal, he has an appointment scheduled for 10/11/2019, instructed to keep his scheduled appointment he verbalize understanding.

## 2019-09-21 NOTE — Addendum Note (Signed)
Addended by: Jones Bales on: 09/21/2019 01:30 PM   Modules accepted: Orders

## 2019-10-11 ENCOUNTER — Encounter: Payer: Medicaid Other | Attending: Registered Nurse | Admitting: Registered Nurse

## 2019-10-11 ENCOUNTER — Other Ambulatory Visit: Payer: Self-pay

## 2019-10-11 ENCOUNTER — Encounter: Payer: Self-pay | Admitting: Registered Nurse

## 2019-10-11 VITALS — BP 128/79 | HR 63 | Temp 98.2°F | Ht 72.0 in | Wt 223.8 lb

## 2019-10-11 DIAGNOSIS — S8412XA Injury of peroneal nerve at lower leg level, left leg, initial encounter: Secondary | ICD-10-CM | POA: Diagnosis not present

## 2019-10-11 DIAGNOSIS — Z79899 Other long term (current) drug therapy: Secondary | ICD-10-CM | POA: Diagnosis not present

## 2019-10-11 DIAGNOSIS — Z5181 Encounter for therapeutic drug level monitoring: Secondary | ICD-10-CM | POA: Diagnosis not present

## 2019-10-11 DIAGNOSIS — G894 Chronic pain syndrome: Secondary | ICD-10-CM | POA: Diagnosis not present

## 2019-10-11 DIAGNOSIS — M79605 Pain in left leg: Secondary | ICD-10-CM | POA: Diagnosis present

## 2019-10-11 MED ORDER — OXYCODONE HCL 5 MG PO TABS: 5.0000 mg | ORAL_TABLET | Freq: Four times a day (QID) | ORAL | 0 refills | Status: DC | PRN

## 2019-10-11 NOTE — Progress Notes (Signed)
Subjective:    Patient ID: Juan [name], male    DOB: 1966-02-06, 54 y.o.   MRN: [id number]  HPI: Juan [name] is a 54 y.o. male who returns for follow up appointment for chronic pain and medication refill. He states his pain is located in his left lower extremity. Also reports he's only receiving one hour of relief of his pain with his current medication regimen. We will change his medication to Oxycodone 5 mg one tablet 4 times a day as needed for pain, he verbalizes understanding. He rates his pain 8. His current exercise regime is walking and performing stretching exercises.  [name] Morphine equivalent is 22.50 MME. UDS ordered today.     Pain Inventory Average Pain 8 Pain Right Now 8 My pain is na  In the last 24 hours, has pain interfered with the following? General activity 6 Relation with others 6 Enjoyment of life 10 What TIME of day is your pain at its worst? evening and night Sleep (in general) Poor  Pain is worse with: walking Pain improves with: medication Relief from Meds: 6  No family history on file. Social History   Socioeconomic History  . Marital status: Divorced    Spouse name: Not on file  . Number of children: Not on file  . Years of education: Not on file  . Highest education level: Not on file  Occupational History  . Not on file  Tobacco Use  . Smoking status: Never Smoker  . Smokeless tobacco: Never Used  Vaping Use  . Vaping Use: Never used  Substance and Sexual Activity  . Alcohol use: Not Currently  . Drug use: Never  . Sexual activity: Not on file  Other Topics Concern  . Not on file  Social History Narrative  . Not on file   Social Determinants of Health   Financial Resource Strain:   . Difficulty of Paying Living Expenses: Not on file  Food Insecurity:   . Worried About Programme researcher, broadcasting/film/video in the Last Year: Not on file  . Ran Out of Food in the Last Year: Not on file  Transportation Needs:   . Lack of Transportation  (Medical): Not on file  . Lack of Transportation (Non-Medical): Not on file  Physical Activity:   . Days of Exercise per Week: Not on file  . Minutes of Exercise per Session: Not on file  Stress:   . Feeling of Stress : Not on file  Social Connections:   . Frequency of Communication with Friends and Family: Not on file  . Frequency of Social Gatherings with Friends and Family: Not on file  . Attends Religious Services: Not on file  . Active Member of Clubs or Organizations: Not on file  . Attends Banker Meetings: Not on file  . Marital Status: Not on file   Past Surgical History:  Procedure Laterality Date  . LEG SURGERY     Past Surgical History:  Procedure Laterality Date  . LEG SURGERY     Past Medical History:  Diagnosis Date  . GERD (gastroesophageal reflux disease)    BP 128/79   Pulse 63   Temp 98.2 F (36.8 C)   Ht 6' (1.829 m)   Wt 223 lb 12.8 oz (101.5 kg)   SpO2 96%   BMI 30.35 kg/m   Opioid Risk Score:   Fall Risk Score:  `1  Depression screen PHQ 2/9  Depression screen [facility] 2/9 10/11/2019 05/18/2019 04/27/2019  Decreased  Interest 0 1 0  Down, Depressed, Hopeless 0 0 0  PHQ - 2 Score 0 1 0  Altered sleeping - 1 -  Tired, decreased energy - 1 -  Change in appetite - 0 -  Feeling bad or failure about yourself  - 0 -  Trouble concentrating - 1 -  Moving slowly or fidgety/restless - 0 -  Suicidal thoughts - 0 -  PHQ-9 Score - 4 -  Difficult doing work/chores - Not difficult at all -   Review of Systems  Constitutional: Negative.   HENT: Negative.   Eyes: Negative.   Respiratory: Negative.   Cardiovascular: Negative.   Gastrointestinal: Negative.   Endocrine: Negative.   Genitourinary: Negative.   Musculoskeletal: Positive for back pain and gait problem.  Skin: Negative.   Allergic/Immunologic: Negative.   Neurological: Positive for weakness.  Hematological: Negative.   Psychiatric/Behavioral: Negative.   All other systems  reviewed and are negative.      Objective:   Physical Exam Vitals and nursing note reviewed.  Constitutional:      Appearance: Normal appearance.  Cardiovascular:     Rate and Rhythm: Normal rate and regular rhythm.     Pulses: Normal pulses.     Heart sounds: Normal heart sounds.  Pulmonary:     Effort: Pulmonary effort is normal.     Breath sounds: Normal breath sounds.  Musculoskeletal:     Cervical back: Normal range of motion and neck supple.     Comments: Normal Muscle Bulk and Muscle Testing Reveals:  Upper Extremities: Full ROM and Muscle Strength 5/5 Lower Extremities: Full ROM and Muscle Strength 5/5 Left Lower Extremity with Muscle Waisting Noted Arises from Table slowly Narrow Based   Gait   Skin:    General: Skin is warm and dry.  Neurological:     Mental Status: He is alert and oriented to person, place, and time.  Psychiatric:        Mood and Affect: Mood normal.        Behavior: Behavior normal.           Assessment & Plan:  1. Chronic Left Common Peroneal Nerve Injury: He has Chronic left foot drop, gait abnormality and chronic neuropathic Pain: Lyrica ineffective he reports. Continue  To Monitor.  RX: Oxycodone 5 mg  One tablet 4 times a day as needed for pain #120.  We will continue the opioid monitoring program, this consists of regular clinic visits, examinations, urine drug screen, pill counts as well as use of [place] Controlled Substance Reporting system.  of face to face patient care time was spent during this visit. All questions were encouraged and answered.  F/U in 1 month

## 2019-10-14 ENCOUNTER — Telehealth: Payer: Self-pay | Admitting: *Deleted

## 2019-10-14 LAB — TOXASSURE SELECT,+ANTIDEPR,UR

## 2019-10-14 NOTE — Telephone Encounter (Signed)
Urine drug screen for this encounter is consistent for prescribed medication 

## 2019-10-20 ENCOUNTER — Telehealth: Payer: Self-pay | Admitting: *Deleted

## 2019-10-20 NOTE — Telephone Encounter (Signed)
Prior authorization oxycodone 5 mg #180 APPROVED  10/12/2019 - 04/13/2020

## 2019-11-08 ENCOUNTER — Other Ambulatory Visit: Payer: Self-pay

## 2019-11-08 ENCOUNTER — Encounter: Payer: Self-pay | Admitting: Registered Nurse

## 2019-11-08 ENCOUNTER — Encounter: Payer: Medicaid Other | Attending: Registered Nurse | Admitting: Registered Nurse

## 2019-11-08 VITALS — BP 131/85 | HR 63 | Temp 98.1°F | Ht 73.0 in | Wt 222.0 lb

## 2019-11-08 DIAGNOSIS — G894 Chronic pain syndrome: Secondary | ICD-10-CM | POA: Insufficient documentation

## 2019-11-08 DIAGNOSIS — Z79899 Other long term (current) drug therapy: Secondary | ICD-10-CM | POA: Insufficient documentation

## 2019-11-08 DIAGNOSIS — Z5181 Encounter for therapeutic drug level monitoring: Secondary | ICD-10-CM | POA: Insufficient documentation

## 2019-11-08 DIAGNOSIS — M79605 Pain in left leg: Secondary | ICD-10-CM | POA: Diagnosis not present

## 2019-11-08 DIAGNOSIS — S8412XA Injury of peroneal nerve at lower leg level, left leg, initial encounter: Secondary | ICD-10-CM | POA: Diagnosis not present

## 2019-11-08 MED ORDER — OXYCODONE HCL 5 MG PO TABS: 5.0000 mg | ORAL_TABLET | Freq: Four times a day (QID) | ORAL | 0 refills | Status: DC | PRN

## 2019-11-08 NOTE — Progress Notes (Signed)
Subjective:    Patient ID: Juan [name], male    DOB: May 24, 1965, 54 y.o.   MRN: [id number]  HPI: Juan [name] is a 54 y.o. male who returns for follow up appointment for chronic pain and medication refill. He stateshis  pain is located in his left lower extremity and left foot. Also reports lower back pain occasionally, he denies back pain at this time. He rates his pain 7. His current exercise regime is walking and performing stretching exercises.  Juan [name] equivalent is 30.00 MME.    Last UDS was Performed on 10/11/2019, it was consistent.    Pain Inventory Average Pain 8 Pain Right Now 7 My pain is sharp  In the last 24 hours, has pain interfered with the following? General activity 5 Relation with others 5 Enjoyment of life 8 What TIME of day is your pain at its worst? morning , daytime, evening and night Sleep (in general) Fair  Pain is worse with: walking, bending, standing and some activites Pain improves with: rest and medication Relief from Meds: 8  History reviewed. No pertinent family history. Social History   Socioeconomic History  . Marital status: Divorced    Spouse name: Not on file  . Number of children: Not on file  . Years of education: Not on file  . Highest education level: Not on file  Occupational History  . Not on file  Tobacco Use  . Smoking status: Never Smoker  . Smokeless tobacco: Never Used  Vaping Use  . Vaping Use: Never used  Substance and Sexual Activity  . Alcohol use: Not Currently  . Drug use: Never  . Sexual activity: Not on file  Other Topics Concern  . Not on file  Social History Narrative  . Not on file   Social Determinants of Health   Financial Resource Strain:   . Difficulty of Paying Living Expenses: Not on file  Food Insecurity:   . Worried About Programme researcher, broadcasting/film/video in the Last Year: Not on file  . Ran Out of Food in the Last Year: Not on file  Transportation Needs:   . Lack of Transportation (Medical):  Not on file  . Lack of Transportation (Non-Medical): Not on file  Physical Activity:   . Days of Exercise per Week: Not on file  . Minutes of Exercise per Session: Not on file  Stress:   . Feeling of Stress : Not on file  Social Connections:   . Frequency of Communication with Friends and Family: Not on file  . Frequency of Social Gatherings with Friends and Family: Not on file  . Attends Religious Services: Not on file  . Active Member of Clubs or Organizations: Not on file  . Attends Banker Meetings: Not on file  . Marital Status: Not on file   Past Surgical History:  Procedure Laterality Date  . LEG SURGERY     Past Surgical History:  Procedure Laterality Date  . LEG SURGERY     Past Medical History:  Diagnosis Date  . GERD (gastroesophageal reflux disease)    BP 131/85   Pulse 63   Temp 98.1 F (36.7 C)   Ht 6\' 1"  (1.854 m)   Wt 222 lb (100.7 kg)   SpO2 98%   BMI 29.29 kg/m   Opioid Risk Score:   Fall Risk Score:  `1  Depression screen PHQ 2/9  Depression screen [facility] 2/9 10/11/2019 05/18/2019 04/27/2019  Decreased Interest 0 1 0  Down, Depressed, Hopeless 0 0 0  PHQ - 2 Score 0 1 0  Altered sleeping - 1 -  Tired, decreased energy - 1 -  Change in appetite - 0 -  Feeling bad or failure about yourself  - 0 -  Trouble concentrating - 1 -  Moving slowly or fidgety/restless - 0 -  Suicidal thoughts - 0 -  PHQ-9 Score - 4 -  Difficult doing work/chores - Not difficult at all -    Review of Systems  Constitutional: Negative.   HENT: Negative.   Eyes: Negative.   Respiratory: Negative.   Cardiovascular: Negative.   Gastrointestinal: Negative.   Endocrine: Negative.   Genitourinary: Negative.   Musculoskeletal: Positive for arthralgias and gait problem.  Skin: Negative.   Allergic/Immunologic: Negative.   Hematological: Negative.   Psychiatric/Behavioral: Negative.   All other systems reviewed and are negative.      Objective:   Physical  Exam Vitals and nursing note reviewed.  Constitutional:      Appearance: Normal appearance.  Cardiovascular:     Rate and Rhythm: Normal rate and regular rhythm.     Pulses: Normal pulses.     Heart sounds: Normal heart sounds.  Pulmonary:     Effort: Pulmonary effort is normal.     Breath sounds: Normal breath sounds.  Musculoskeletal:     Cervical back: Normal range of motion and neck supple.     Comments: Normal Muscle Bulk and Muscle Testing Reveals:  Upper Extremities: Full  ROM and Muscle Strength 5/5 Lower Extremities: Full ROM and Muscle Strength 5/5 Left Lower Extremity with Muscle Waisting Arises from Chair with ease using cane for support Narrow Based  Gait   Skin:    General: Skin is warm and dry.  Neurological:     Mental Status: He is alert and oriented to person, place, and time.  Psychiatric:        Mood and Affect: Mood normal.        Behavior: Behavior normal.           Assessment & Plan:  1. Chronic Left Common Peroneal Nerve Injury: He has Chronic left foot drop, gait abnormality and chronic neuropathic Pain: Lyrica ineffective he reports. Continue  To Monitor.  Refilled: Oxycodone 5 mg  One tablet 4 times a day as needed for pain #120. Hydrocodone destroyed by office policy.  We will continue the opioid monitoring program, this consists of regular clinic visits, examinations, urine drug screen, pill counts as well as use of [place] Controlled Substance Reporting system. A 12 month History has been reviewed on the [place] Controlled Substance Reporting System on 11/08/2019.    of face to face patient care time was spent during this visit. All questions were encouraged and answered.  F/U in 1 month

## 2019-12-03 ENCOUNTER — Telehealth: Payer: Self-pay | Admitting: *Deleted

## 2019-12-03 NOTE — Telephone Encounter (Signed)
Return Mr. Dethloff call, he will continue current medication regimen. We will discuss his pain next week at his scheduled appointment. He verbalizes understanding.

## 2019-12-03 NOTE — Telephone Encounter (Signed)
Mr Juan [name] called to let [name] know that his medication is wearing off too soon. Please advise.

## 2019-12-07 ENCOUNTER — Other Ambulatory Visit: Payer: Self-pay

## 2019-12-08 ENCOUNTER — Other Ambulatory Visit: Payer: Self-pay

## 2019-12-08 ENCOUNTER — Encounter: Payer: Medicaid Other | Attending: Registered Nurse | Admitting: Registered Nurse

## 2019-12-08 ENCOUNTER — Encounter: Payer: Self-pay | Admitting: Registered Nurse

## 2019-12-08 VITALS — BP 135/82 | HR 67 | Temp 98.5°F | Ht 73.0 in | Wt 223.2 lb

## 2019-12-08 DIAGNOSIS — G894 Chronic pain syndrome: Secondary | ICD-10-CM | POA: Insufficient documentation

## 2019-12-08 DIAGNOSIS — M79605 Pain in left leg: Secondary | ICD-10-CM | POA: Insufficient documentation

## 2019-12-08 DIAGNOSIS — S8412XA Injury of peroneal nerve at lower leg level, left leg, initial encounter: Secondary | ICD-10-CM | POA: Insufficient documentation

## 2019-12-08 DIAGNOSIS — Z79899 Other long term (current) drug therapy: Secondary | ICD-10-CM | POA: Diagnosis present

## 2019-12-08 DIAGNOSIS — Z5181 Encounter for therapeutic drug level monitoring: Secondary | ICD-10-CM | POA: Insufficient documentation

## 2019-12-08 MED ORDER — OXYCODONE HCL 10 MG PO TABS: 10.0000 mg | ORAL_TABLET | Freq: Three times a day (TID) | ORAL | 0 refills | Status: DC | PRN

## 2019-12-08 NOTE — Progress Notes (Signed)
Subjective:    Patient ID: Juan [name], male    DOB: [birth year], 54 y.o.   MRN: [id number]  HPI: Juan [name] is a 54 y.o. male who returns for follow up appointment for chronic pain and medication refill. He states his pain is located in his left lower extremity. Also reports he's only receiving 2 hours of relief with his current medication regimen. He rates his pain 2. His current exercise regime is walking and performing stretching exercises.  Juan [name] Morphine equivalent is 30.00  MME.    Last UDS was Performed on 10/11/2019, it was consistent.    Pain Inventory Average Pain 8 Pain Right Now 2 My pain is sharp, burning, stabbing, tingling and aching  In the last 24 hours, has pain interfered with the following? General activity 9 Relation with others 8 Enjoyment of life 9 What TIME of day is your pain at its worst? night Sleep (in general) Fair  Pain is worse with: unsure Pain improves with: medication Relief from Meds: 9  No family history on file. Social History   Socioeconomic History  . Marital status: Divorced    Spouse name: Not on file  . Number of children: Not on file  . Years of education: Not on file  . Highest education level: Not on file  Occupational History  . Not on file  Tobacco Use  . Smoking status: Never Smoker  . Smokeless tobacco: Never Used  Vaping Use  . Vaping Use: Never used  Substance and Sexual Activity  . Alcohol use: Not Currently  . Drug use: Never  . Sexual activity: Not on file  Other Topics Concern  . Not on file  Social History Narrative  . Not on file   Social Determinants of Health   Financial Resource Strain:   . Difficulty of Paying Living Expenses: Not on file  Food Insecurity:   . Worried About Programme researcher, broadcasting/film/video in the Last Year: Not on file  . Ran Out of Food in the Last Year: Not on file  Transportation Needs:   . Lack of Transportation (Medical): Not on file  . Lack of Transportation (Non-Medical): Not on  file  Physical Activity:   . Days of Exercise per Week: Not on file  . Minutes of Exercise per Session: Not on file  Stress:   . Feeling of Stress : Not on file  Social Connections:   . Frequency of Communication with Friends and Family: Not on file  . Frequency of Social Gatherings with Friends and Family: Not on file  . Attends Religious Services: Not on file  . Active Member of Clubs or Organizations: Not on file  . Attends Banker Meetings: Not on file  . Marital Status: Not on file   Past Surgical History:  Procedure Laterality Date  . LEG SURGERY     Past Surgical History:  Procedure Laterality Date  . LEG SURGERY     Past Medical History:  Diagnosis Date  . GERD (gastroesophageal reflux disease)    BP 135/82   Pulse 67   Temp 98.5 F (36.9 C)   Ht 6\' 1"  (1.854 m)   Wt 223 lb 3.2 oz (101.2 kg)   SpO2 97%   BMI 29.45 kg/m   Opioid Risk Score:   Fall Risk Score:  `1  Depression screen PHQ 2/9  Depression screen [facility] 2/9 12/08/2019 10/11/2019 05/18/2019 04/27/2019  Decreased Interest 0 0 1 0  Down, Depressed, Hopeless 0 0  0 0  PHQ - 2 Score 0 0 1 0  Altered sleeping - - 1 -  Tired, decreased energy - - 1 -  Change in appetite - - 0 -  Feeling bad or failure about yourself  - - 0 -  Trouble concentrating - - 1 -  Moving slowly or fidgety/restless - - 0 -  Suicidal thoughts - - 0 -  PHQ-9 Score - - 4 -  Difficult doing work/chores - - Not difficult at all -   Review of Systems  HENT: Negative.   Eyes: Negative.   Respiratory: Negative.   Cardiovascular: Negative.   Gastrointestinal: Negative.   Endocrine: Negative.   Genitourinary: Negative.   Musculoskeletal: Positive for arthralgias and gait problem.  Skin: Negative.   Hematological: Negative.   Psychiatric/Behavioral: Negative.   All other systems reviewed and are negative.      Objective:   Physical Exam Vitals and nursing note reviewed.  Constitutional:      Appearance: Normal  appearance.  Cardiovascular:     Rate and Rhythm: Normal rate and regular rhythm.     Pulses: Normal pulses.     Heart sounds: Normal heart sounds.  Musculoskeletal:     Cervical back: Normal range of motion and neck supple.     Comments:  Muscle Bulk and Muscle Testing Reveals:  Upper Extremities: Full ROM and Muscle Strength 5/5  Lower Extremities: Left lower extremity with Muscle waisting.  Full ROM and Muscle Strength 5/5  Arises from chair with ease Narrow Based Gait   Skin:    General: Skin is warm and dry.  Neurological:     Mental Status: He is alert and oriented to person, place, and time.  Psychiatric:        Mood and Affect: Mood normal.        Behavior: Behavior normal.           Assessment & Plan:  1. Chronic Left Common Peroneal Nerve Injury: He has Chronic left foot drop, gait abnormality and chronic neuropathic Pain:Lyrica ineffective he reports.ContinueTo Monitor. Refilled: Increased:Oxycodone 10 mg One tablet 3 times a day as needed for pain # 90.   We will continue the opioid monitoring program, this consists of regular clinic visits, examinations, urine drug screen, pill counts as well as use of [place] Controlled Substance Reporting system. A 12 month History has been reviewed on the [place] Controlled Substance Reporting System on 12/08/2019.    of face to face patient care time was spent during this visit. All questions were encouraged and answered.  F/U in 1 month

## 2020-01-04 ENCOUNTER — Encounter: Payer: Medicaid Other | Attending: Registered Nurse | Admitting: Registered Nurse

## 2020-01-04 DIAGNOSIS — Z79899 Other long term (current) drug therapy: Secondary | ICD-10-CM | POA: Insufficient documentation

## 2020-01-04 DIAGNOSIS — S8412XA Injury of peroneal nerve at lower leg level, left leg, initial encounter: Secondary | ICD-10-CM | POA: Insufficient documentation

## 2020-01-04 DIAGNOSIS — M79605 Pain in left leg: Secondary | ICD-10-CM | POA: Insufficient documentation

## 2020-01-04 DIAGNOSIS — G894 Chronic pain syndrome: Secondary | ICD-10-CM | POA: Insufficient documentation

## 2020-01-04 DIAGNOSIS — Z5181 Encounter for therapeutic drug level monitoring: Secondary | ICD-10-CM | POA: Insufficient documentation

## 2020-02-29 ENCOUNTER — Telehealth: Payer: Self-pay | Admitting: Registered Nurse

## 2020-02-29 NOTE — Telephone Encounter (Signed)
Juan [name] has questions about his medication would like to speak to you

## 2020-02-29 NOTE — Telephone Encounter (Signed)
Placed a call to Juan [name], he reports he was following up with other specialist offices over the last few months, for different medical conditions. He was given a scheduled appointment for Thursday, he verbalizes understanding.

## 2020-03-02 ENCOUNTER — Other Ambulatory Visit: Payer: Self-pay

## 2020-03-02 ENCOUNTER — Encounter: Payer: Self-pay | Admitting: Registered Nurse

## 2020-03-02 ENCOUNTER — Encounter: Payer: Medicaid Other | Attending: Registered Nurse | Admitting: Registered Nurse

## 2020-03-02 VITALS — BP 149/84 | HR 65 | Temp 98.5°F | Ht 73.0 in | Wt 232.2 lb

## 2020-03-02 DIAGNOSIS — Z5181 Encounter for therapeutic drug level monitoring: Secondary | ICD-10-CM | POA: Insufficient documentation

## 2020-03-02 DIAGNOSIS — S8412XA Injury of peroneal nerve at lower leg level, left leg, initial encounter: Secondary | ICD-10-CM | POA: Diagnosis not present

## 2020-03-02 DIAGNOSIS — M545 Low back pain, unspecified: Secondary | ICD-10-CM | POA: Diagnosis present

## 2020-03-02 DIAGNOSIS — G894 Chronic pain syndrome: Secondary | ICD-10-CM | POA: Insufficient documentation

## 2020-03-02 DIAGNOSIS — G8929 Other chronic pain: Secondary | ICD-10-CM | POA: Diagnosis present

## 2020-03-02 DIAGNOSIS — Z79899 Other long term (current) drug therapy: Secondary | ICD-10-CM | POA: Insufficient documentation

## 2020-03-02 DIAGNOSIS — X58XXXA Exposure to other specified factors, initial encounter: Secondary | ICD-10-CM | POA: Diagnosis not present

## 2020-03-02 DIAGNOSIS — M79605 Pain in left leg: Secondary | ICD-10-CM | POA: Diagnosis present

## 2020-03-02 NOTE — Progress Notes (Signed)
Subjective:    Patient ID: Juan [name], male    DOB: 04-23-1965, 55 y.o.   MRN: [id number]  HPI: Juan [name] is a 55 y.o. male who returns for follow up appointment for chronic pain and medication refill. He states his pain is located in his lower back and left lower extremity. He rates his pain 9. His  current exercise regime is walking and performing stretching exercises.  Juan [name] visit to office was in October 2021, he reports he was being seen by other physicians for his elevated PSA and GERD. notes was reviewed in Epic.   UDS ordered today, [name] prescription of Oxycodone was filled in 11/2019.     Pain Inventory Average Pain 9 Pain Right Now 9 My pain is sharp and stabbing  In the [name] 24 hours, has pain interfered with the following? General activity 0 Relation with others 0 Enjoyment of life 0 What TIME of day is your pain at its worst? night Sleep (in general) Fair  Pain is worse with: inactivity Pain improves with: medication Relief from Meds: 9  No family history on file. Social History   Socioeconomic History  . Marital status: Divorced    Spouse name: Not on file  . Number of children: Not on file  . Years of education: Not on file  . Highest education level: Not on file  Occupational History  . Not on file  Tobacco Use  . Smoking status: Never Smoker  . Smokeless tobacco: Never Used  Vaping Use  . Vaping Use: Never used  Substance and Sexual Activity  . Alcohol use: Not Currently  . Drug use: Never  . Sexual activity: Not on file  Other Topics Concern  . Not on file  Social History Narrative  . Not on file   Social Determinants of Health   Financial Resource Strain: Not on file  Food Insecurity: Not on file  Transportation Needs: Not on file  Physical Activity: Not on file  Stress: Not on file  Social Connections: Not on file   Past Surgical History:  Procedure Laterality Date  . LEG SURGERY     Past Surgical History:  Procedure  Laterality Date  . LEG SURGERY     Past Medical History:  Diagnosis Date  . GERD (gastroesophageal reflux disease)    BP (!) 142/84   Pulse 65   Temp 98.5 F (36.9 C)   Ht 6\' 1"  (1.854 m)   Wt 232 lb 3.2 oz (105.3 kg)   SpO2 98%   BMI 30.64 kg/m   Opioid Risk Score:   Fall Risk Score:  `1  Depression screen PHQ 2/9  Depression screen [facility] 2/9 12/08/2019 10/11/2019 05/18/2019 04/27/2019  Decreased Interest 0 0 1 0  Down, Depressed, Hopeless 0 0 0 0  PHQ - 2 Score 0 0 1 0  Altered sleeping - - 1 -  Tired, decreased energy - - 1 -  Change in appetite - - 0 -  Feeling bad or failure about yourself  - - 0 -  Trouble concentrating - - 1 -  Moving slowly or fidgety/restless - - 0 -  Suicidal thoughts - - 0 -  PHQ-9 Score - - 4 -  Difficult doing work/chores - - Not difficult at all -   Review of Systems  Constitutional: Negative.   HENT: Negative.   Eyes: Negative.   Respiratory: Negative.   Cardiovascular: Negative.   Gastrointestinal: Negative.   Endocrine: Negative.   Genitourinary:  Negative.   Musculoskeletal: Positive for gait problem.       Has cam boot on  Skin: Negative.   Allergic/Immunologic: Negative.   Hematological: Negative.   Psychiatric/Behavioral: Negative.   All other systems reviewed and are negative.      Objective:   Physical Exam Vitals and nursing note reviewed.  Constitutional:      Appearance: Normal appearance.  Cardiovascular:     Rate and Rhythm: Normal rate and regular rhythm.     Pulses: Normal pulses.     Heart sounds: Normal heart sounds.  Pulmonary:     Effort: Pulmonary effort is normal.     Breath sounds: Normal breath sounds.  Musculoskeletal:     Cervical back: Normal range of motion and neck supple.     Comments: Normal Muscle Bulk and Muscle Testing Reveals:  Upper Extremities: Full ROM and Muscle Strength 5/5 Lower Extremities " Right : Full ROM and Muscle Strength 5/5 Left lower Extremity: Decreased ROM : Wearing  CamBoot Arises from chair slowly using cane for support Antalgic Gait   Skin:    General: Skin is warm and dry.  Neurological:     Mental Status: He is alert and oriented to person, place, and time.  Psychiatric:        Mood and Affect: Mood normal.        Behavior: Behavior normal.           Assessment & Plan:  1. Chronic Left Common Peroneal Nerve Injury: He has Chronic left foot drop, gait abnormality and chronic neuropathic Pain:Lyrica ineffective he reports.ContinueTo Monitor. UDS ordered today, awaiting results. If UDS is consistent we will resume his Oxycodone 10 mg One tablet 3 times a day as needed for pain # 90. We will continue the opioid monitoring program, this consists of regular clinic visits, examinations, urine drug screen, pill counts as well as use of [place] Controlled Substance Reporting system. A 12 month History has been reviewed on the [place] Controlled Substance Reporting Systemon 03/02/2020.  F/U in 1 month

## 2020-03-09 LAB — TOXASSURE SELECT,+ANTIDEPR,UR

## 2020-03-10 ENCOUNTER — Telehealth: Payer: Self-pay | Admitting: *Deleted

## 2020-03-10 ENCOUNTER — Telehealth: Payer: Self-pay | Admitting: Registered Nurse

## 2020-03-10 NOTE — Telephone Encounter (Signed)
Juan [name] is requesting a call back from [facility].  #[phone].

## 2020-03-10 NOTE — Telephone Encounter (Signed)
Juan [name] would like you to call him in regards to his medication please

## 2020-03-13 ENCOUNTER — Telehealth: Payer: Self-pay | Admitting: *Deleted

## 2020-03-13 ENCOUNTER — Telehealth: Payer: Self-pay | Admitting: Registered Nurse

## 2020-03-13 MED ORDER — OXYCODONE HCL 10 MG PO TABS: 10.0000 mg | ORAL_TABLET | Freq: Three times a day (TID) | ORAL | 0 refills | Status: DC | PRN

## 2020-03-13 NOTE — Telephone Encounter (Signed)
PMP was Reviewed: UDS Reviewed.  Oxycodone e-scribed. This provider placed a call to Juan [name] regarding the above, he verbalizes understanding.

## 2020-03-13 NOTE — Telephone Encounter (Signed)
UDS Reviewed: Juan [name] is aware the [facility] is down. He will call office at 1:00, to see if it's on line. His February appointment was changed to 04/05/2020 at 9:40. He verbalizes understanding.

## 2020-03-13 NOTE — Telephone Encounter (Signed)
Urine drug screen for this encounter is consistent for having NO prescribed controlled medication.

## 2020-03-28 ENCOUNTER — Ambulatory Visit: Payer: Medicaid Other | Admitting: Registered Nurse

## 2020-04-05 ENCOUNTER — Encounter: Payer: Medicaid Other | Attending: Registered Nurse | Admitting: Registered Nurse

## 2020-04-05 DIAGNOSIS — G894 Chronic pain syndrome: Secondary | ICD-10-CM | POA: Insufficient documentation

## 2020-04-05 DIAGNOSIS — Z5181 Encounter for therapeutic drug level monitoring: Secondary | ICD-10-CM | POA: Insufficient documentation

## 2020-04-05 DIAGNOSIS — M545 Low back pain, unspecified: Secondary | ICD-10-CM | POA: Insufficient documentation

## 2020-04-05 DIAGNOSIS — M79605 Pain in left leg: Secondary | ICD-10-CM | POA: Insufficient documentation

## 2020-04-05 DIAGNOSIS — G8929 Other chronic pain: Secondary | ICD-10-CM | POA: Insufficient documentation

## 2020-04-05 DIAGNOSIS — S8412XA Injury of peroneal nerve at lower leg level, left leg, initial encounter: Secondary | ICD-10-CM | POA: Insufficient documentation

## 2020-04-05 DIAGNOSIS — Z79899 Other long term (current) drug therapy: Secondary | ICD-10-CM | POA: Insufficient documentation

## 2020-04-11 ENCOUNTER — Other Ambulatory Visit: Payer: Self-pay

## 2020-04-11 ENCOUNTER — Encounter: Payer: Medicaid Other | Admitting: Registered Nurse

## 2020-04-11 ENCOUNTER — Encounter: Payer: Self-pay | Admitting: Registered Nurse

## 2020-04-11 VITALS — BP 137/81 | HR 59 | Temp 97.6°F | Ht 73.0 in | Wt 229.2 lb

## 2020-04-11 DIAGNOSIS — G894 Chronic pain syndrome: Secondary | ICD-10-CM | POA: Diagnosis not present

## 2020-04-11 DIAGNOSIS — Z79899 Other long term (current) drug therapy: Secondary | ICD-10-CM

## 2020-04-11 DIAGNOSIS — S8412XA Injury of peroneal nerve at lower leg level, left leg, initial encounter: Secondary | ICD-10-CM | POA: Diagnosis not present

## 2020-04-11 DIAGNOSIS — M545 Low back pain, unspecified: Secondary | ICD-10-CM | POA: Diagnosis not present

## 2020-04-11 DIAGNOSIS — M79605 Pain in left leg: Secondary | ICD-10-CM | POA: Diagnosis not present

## 2020-04-11 DIAGNOSIS — Z5181 Encounter for therapeutic drug level monitoring: Secondary | ICD-10-CM | POA: Diagnosis present

## 2020-04-11 DIAGNOSIS — G8929 Other chronic pain: Secondary | ICD-10-CM | POA: Diagnosis present

## 2020-04-11 MED ORDER — OXYCODONE HCL 10 MG PO TABS: 10.0000 mg | ORAL_TABLET | Freq: Three times a day (TID) | ORAL | 0 refills | Status: DC | PRN

## 2020-04-11 NOTE — Progress Notes (Signed)
Subjective:    Patient ID: Juan [name], male    DOB: 11/16/1965, 55 y.o.   MRN: [id number]  HPI: Juan [name] is a 55 y.o. male who returns for follow up appointment for chronic pain and medication refill. He states his pain is located in his lower back and left foot. He rates his pain 8. His current exercise regime is walking and performing stretching exercises.  Ms. [place] Morphine equivalent is 45.00 MME.  Last UDS was performed on 03/02/2020, it was consistent.    Pain Inventory Average Pain 8 Pain Right Now 8 My pain is intermittent, sharp, stabbing and aching  In the last 24 hours, has pain interfered with the following? General activity 6 Relation with others 7 Enjoyment of life 8 What TIME of day is your pain at its worst? morning , daytime, evening and night Sleep (in general) Poor  Pain is worse with: walking and while sleeping Pain improves with: rest and medication Relief from Meds: 10  History reviewed. No pertinent family history. Social History   Socioeconomic History  . Marital status: Divorced    Spouse name: Not on file  . Number of children: Not on file  . Years of education: Not on file  . Highest education level: Not on file  Occupational History  . Not on file  Tobacco Use  . Smoking status: Never Smoker  . Smokeless tobacco: Never Used  Vaping Use  . Vaping Use: Never used  Substance and Sexual Activity  . Alcohol use: Not Currently  . Drug use: Never  . Sexual activity: Not on file  Other Topics Concern  . Not on file  Social History Narrative  . Not on file   Social Determinants of Health   Financial Resource Strain: Not on file  Food Insecurity: Not on file  Transportation Needs: Not on file  Physical Activity: Not on file  Stress: Not on file  Social Connections: Not on file   Past Surgical History:  Procedure Laterality Date  . LEG SURGERY     Past Surgical History:  Procedure Laterality Date  . LEG SURGERY     Past  Medical History:  Diagnosis Date  . GERD (gastroesophageal reflux disease)    BP 137/81   Pulse (!) 59   Temp 97.6 F (36.4 C)   Ht 6\' 1"  (1.854 m)   Wt 229 lb 3.2 oz (104 kg)   SpO2 95%   BMI 30.24 kg/m   Opioid Risk Score:   Fall Risk Score:  `1  Depression screen PHQ 2/9  Depression screen [facility] 2/9 04/11/2020 12/08/2019 10/11/2019 05/18/2019 04/27/2019  Decreased Interest 0 0 0 1 0  Down, Depressed, Hopeless 0 0 0 0 0  PHQ - 2 Score 0 0 0 1 0  Altered sleeping - - - 1 -  Tired, decreased energy - - - 1 -  Change in appetite - - - 0 -  Feeling bad or failure about yourself  - - - 0 -  Trouble concentrating - - - 1 -  Moving slowly or fidgety/restless - - - 0 -  Suicidal thoughts - - - 0 -  PHQ-9 Score - - - 4 -  Difficult doing work/chores - - - Not difficult at all -   Review of Systems  Musculoskeletal: Positive for back pain and gait problem.       Left leg pain  All other systems reviewed and are negative.      Objective:  Physical Exam Vitals and nursing note reviewed.  Constitutional:      Appearance: Normal appearance.  Cardiovascular:     Rate and Rhythm: Normal rate and regular rhythm.     Pulses: Normal pulses.     Heart sounds: Normal heart sounds.  Pulmonary:     Effort: Pulmonary effort is normal.     Breath sounds: Normal breath sounds.  Musculoskeletal:     Cervical back: Normal range of motion and neck supple.     Comments: Normal Muscle Bulk and Muscle Testing Reveals:  Upper Extremities: Full ROM and Muscle Strength 5/5  Lumbar Paraspinal Tenderness: L-4-L-5 Lower Extremities: Full ROM and Muscle Strength 5/5 Arises from chair slowly using cane for support Narrow Based Gait   Skin:    General: Skin is warm and dry.  Neurological:     Mental Status: He is alert and oriented to person, place, and time.  Psychiatric:        Mood and Affect: Mood normal.        Behavior: Behavior normal.           Assessment & Plan:  1. Chronic Left  Common Peroneal Nerve Injury: He has Chronic left foot drop, gait abnormality and chronic neuropathic Pain:Lyrica ineffective he reports.ContinueTo Monitor. 04/11/2020 Refilled: Oxycodone10mg  one tablet3times a day as needed for pain # 90. We will continue the opioid monitoring program, this consists of regular clinic visits, examinations, urine drug screen, pill counts as well as use of [place] Controlled Substance Reporting system. A 12 month History has been reviewed on the [place] Controlled Substance Reporting Systemon02/22/2022.  F/U in 1 month

## 2020-05-09 ENCOUNTER — Encounter: Payer: Medicaid Other | Admitting: Registered Nurse

## 2020-05-09 NOTE — Progress Notes (Deleted)
   Subjective:    Patient ID: Juan [name], male    DOB: [birth year], 55 y.o.   MRN: [id number]  HPI  Pain Inventory Average Pain {NUMBERS; 0-10:5044} Pain Right Now {NUMBERS; 0-10:5044} My pain is {PAIN DESCRIPTION:21022940}  In the last 24 hours, has pain interfered with the following? General activity {NUMBERS; 0-10:5044} Relation with others {NUMBERS; 0-10:5044} Enjoyment of life {NUMBERS; 0-10:5044} What TIME of day is your pain at its worst? {time of day:24191} Sleep (in general) {BHH GOOD/FAIR/POOR:22877}  Pain is worse with: {ACTIVITIES:21022942} Pain improves with: {PAIN IMPROVES YYTK:35465681} Relief from Meds: {NUMBERS; 0-10:5044}  No family history on file. Social History   Socioeconomic History  . Marital status: Divorced    Spouse name: Not on file  . Number of children: Not on file  . Years of education: Not on file  . Highest education level: Not on file  Occupational History  . Not on file  Tobacco Use  . Smoking status: Never Smoker  . Smokeless tobacco: Never Used  Vaping Use  . Vaping Use: Never used  Substance and Sexual Activity  . Alcohol use: Not Currently  . Drug use: Never  . Sexual activity: Not on file  Other Topics Concern  . Not on file  Social History Narrative  . Not on file   Social Determinants of Health   Financial Resource Strain: Not on file  Food Insecurity: Not on file  Transportation Needs: Not on file  Physical Activity: Not on file  Stress: Not on file  Social Connections: Not on file   Past Surgical History:  Procedure Laterality Date  . LEG SURGERY     Past Surgical History:  Procedure Laterality Date  . LEG SURGERY     Past Medical History:  Diagnosis Date  . GERD (gastroesophageal reflux disease)    There were no vitals taken for this visit.  Opioid Risk Score:   Fall Risk Score:  `1  Depression screen PHQ 2/9  Depression screen [facility] 2/9 04/11/2020 12/08/2019 10/11/2019 05/18/2019 04/27/2019  Decreased  Interest 0 0 0 1 0  Down, Depressed, Hopeless 0 0 0 0 0  PHQ - 2 Score 0 0 0 1 0  Altered sleeping - - - 1 -  Tired, decreased energy - - - 1 -  Change in appetite - - - 0 -  Feeling bad or failure about yourself  - - - 0 -  Trouble concentrating - - - 1 -  Moving slowly or fidgety/restless - - - 0 -  Suicidal thoughts - - - 0 -  PHQ-9 Score - - - 4 -  Difficult doing work/chores - - - Not difficult at all -      Review of Systems     Objective:   Physical Exam        Assessment & Plan:

## 2020-05-10 ENCOUNTER — Encounter: Payer: Medicaid Other | Attending: Registered Nurse | Admitting: Registered Nurse

## 2020-05-10 ENCOUNTER — Other Ambulatory Visit: Payer: Self-pay

## 2020-05-10 VITALS — BP 133/79 | HR 80 | Temp 98.1°F | Ht 73.0 in | Wt 228.8 lb

## 2020-05-10 DIAGNOSIS — Z79899 Other long term (current) drug therapy: Secondary | ICD-10-CM | POA: Diagnosis present

## 2020-05-10 DIAGNOSIS — M79605 Pain in left leg: Secondary | ICD-10-CM | POA: Insufficient documentation

## 2020-05-10 DIAGNOSIS — G894 Chronic pain syndrome: Secondary | ICD-10-CM | POA: Insufficient documentation

## 2020-05-10 DIAGNOSIS — Z5181 Encounter for therapeutic drug level monitoring: Secondary | ICD-10-CM | POA: Insufficient documentation

## 2020-05-10 DIAGNOSIS — S8412XA Injury of peroneal nerve at lower leg level, left leg, initial encounter: Secondary | ICD-10-CM | POA: Diagnosis not present

## 2020-05-10 MED ORDER — OXYCODONE HCL 10 MG PO TABS: 10.0000 mg | ORAL_TABLET | Freq: Three times a day (TID) | ORAL | 0 refills | Status: DC | PRN

## 2020-05-10 NOTE — Progress Notes (Signed)
Subjective:    Patient ID: Juan [name], male    DOB: [birth year], 55 y.o.   MRN: [id number]  HPI: [name] is a 55 y.o. male who returns for follow up appointment for chronic pain and medication refill. He states his pain is located in his left lower extremity. He rates his pain 8. His  current exercise regime is walking and performing stretching exercises.  Mr. [place] Morphine equivalent is 45.00 MME.  Last UDS was Performed on 03/02/2020, it was consistent.     Pain Inventory Average Pain 8 Pain Right Now 8 My pain is intermittent, constant, stabbing and aching  In the last 24 hours, has pain interfered with the following? General activity 6 Relation with others 7 Enjoyment of life 8 What TIME of day is your pain at its worst? morning , daytime, evening and night Sleep (in general) Poor  Pain is worse with: walking and while sleeping Pain improves with: rest and medication Relief from Meds: 10  No family history on file. Social History   Socioeconomic History  . Marital status: Divorced    Spouse name: Not on file  . Number of children: Not on file  . Years of education: Not on file  . Highest education level: Not on file  Occupational History  . Not on file  Tobacco Use  . Smoking status: Never Smoker  . Smokeless tobacco: Never Used  Vaping Use  . Vaping Use: Never used  Substance and Sexual Activity  . Alcohol use: Not Currently  . Drug use: Never  . Sexual activity: Not on file  Other Topics Concern  . Not on file  Social History Narrative  . Not on file   Social Determinants of Health   Financial Resource Strain: Not on file  Food Insecurity: Not on file  Transportation Needs: Not on file  Physical Activity: Not on file  Stress: Not on file  Social Connections: Not on file   Past Surgical History:  Procedure Laterality Date  . LEG SURGERY     Past Surgical History:  Procedure Laterality Date  . LEG SURGERY     Past Medical History:   Diagnosis Date  . GERD (gastroesophageal reflux disease)    BP 133/79   Pulse 80   Temp 98.1 F (36.7 C)   Ht 6\' 1"  (1.854 m)   Wt 228 lb 12.8 oz (103.8 kg)   SpO2 95%   BMI 30.19 kg/m   Opioid Risk Score:   Fall Risk Score:  `1  Depression screen PHQ 2/9  Depression screen [facility] 2/9 04/11/2020 12/08/2019 10/11/2019 05/18/2019 04/27/2019  Decreased Interest 0 0 0 1 0  Down, Depressed, Hopeless 0 0 0 0 0  PHQ - 2 Score 0 0 0 1 0  Altered sleeping - - - 1 -  Tired, decreased energy - - - 1 -  Change in appetite - - - 0 -  Feeling bad or failure about yourself  - - - 0 -  Trouble concentrating - - - 1 -  Moving slowly or fidgety/restless - - - 0 -  Suicidal thoughts - - - 0 -  PHQ-9 Score - - - 4 -  Difficult doing work/chores - - - Not difficult at all -    Review of Systems  Musculoskeletal: Positive for back pain and gait problem.       Lower leg  All other systems reviewed and are negative.      Objective:   Physical  Exam Vitals and nursing note reviewed.  Constitutional:      Appearance: Normal appearance.  Cardiovascular:     Rate and Rhythm: Normal rate and regular rhythm.     Pulses: Normal pulses.     Heart sounds: Normal heart sounds.  Pulmonary:     Effort: Pulmonary effort is normal.     Breath sounds: Normal breath sounds.  Musculoskeletal:     Cervical back: Normal range of motion and neck supple.     Comments: Normal Muscle Bulk and Muscle Testing Reveals:  Upper Extremities: Full ROM and Muscle Strength 5/5 Lower Extremities: Full ROM and Muscle Strength 5/5 Arises from Table slowly using cane for support Narrow Based  Gait   Skin:    General: Skin is warm and dry.  Neurological:     Mental Status: He is alert and oriented to person, place, and time.  Psychiatric:        Mood and Affect: Mood normal.        Behavior: Behavior normal.           Assessment & Plan:  1. Chronic Left Common Peroneal Nerve Injury: He has Chronic left foot  drop, gait abnormality and chronic neuropathic Pain:Lyrica ineffective he reports.ContinueTo Monitor. 05/10/2020 Refilled: Oxycodone10mg  one tablet3times a day as needed for pain # 90. We will continue the opioid monitoring program, this consists of regular clinic visits, examinations, urine drug screen, pill counts as well as use of [place] Controlled Substance Reporting system. A 12 month History has been reviewed on the [place] Controlled Substance Reporting Systemon03/23/2022.  F/U in 1 month

## 2020-05-11 ENCOUNTER — Encounter: Payer: Self-pay | Admitting: Registered Nurse

## 2020-06-06 ENCOUNTER — Encounter: Payer: Self-pay | Admitting: Registered Nurse

## 2020-06-06 ENCOUNTER — Encounter: Payer: Medicaid Other | Attending: Registered Nurse | Admitting: Registered Nurse

## 2020-06-06 ENCOUNTER — Other Ambulatory Visit: Payer: Self-pay

## 2020-06-06 VITALS — BP 112/72 | HR 64 | Temp 98.2°F | Ht 73.0 in | Wt 228.0 lb

## 2020-06-06 DIAGNOSIS — Z5181 Encounter for therapeutic drug level monitoring: Secondary | ICD-10-CM

## 2020-06-06 DIAGNOSIS — G894 Chronic pain syndrome: Secondary | ICD-10-CM

## 2020-06-06 DIAGNOSIS — M79605 Pain in left leg: Secondary | ICD-10-CM | POA: Insufficient documentation

## 2020-06-06 DIAGNOSIS — S8412XA Injury of peroneal nerve at lower leg level, left leg, initial encounter: Secondary | ICD-10-CM | POA: Insufficient documentation

## 2020-06-06 DIAGNOSIS — Z79899 Other long term (current) drug therapy: Secondary | ICD-10-CM | POA: Insufficient documentation

## 2020-06-06 MED ORDER — OXYCODONE HCL 10 MG PO TABS: 10.0000 mg | ORAL_TABLET | Freq: Three times a day (TID) | ORAL | 0 refills | Status: DC | PRN

## 2020-06-06 NOTE — Progress Notes (Signed)
Subjective:    Patient ID: Juan [name], male    DOB: 1965-05-14, 55 y.o.   MRN: [id number]  HPI: Juan [name] is a 55 y.o. male who returns for follow up appointment for chronic pain and medication refill. He states his pain is located in his left lower extremity and left foot. He rates his pain 8. His  current exercise regime is walking and performing stretching exercises.  Juan [name] Morphine equivalent is 45.00  MME. Last UDS was Performed on 03/02/2020, it was consistent.      Pain Inventory Average Pain 9 Pain Right Now 8 My pain is constant, sharp, stabbing and aching  In the last 24 hours, has pain interfered with the following? General activity 2 Relation with others 2 Enjoyment of life 0 What TIME of day is your pain at its worst? night Sleep (in general) Fair  Pain is worse with: inactivity and unsure Pain improves with: medication Relief from Meds: 9  No family history on file. Social History   Socioeconomic History  . Marital status: Divorced    Spouse name: Not on file  . Number of children: Not on file  . Years of education: Not on file  . Highest education level: Not on file  Occupational History  . Not on file  Tobacco Use  . Smoking status: Never Smoker  . Smokeless tobacco: Never Used  Vaping Use  . Vaping Use: Never used  Substance and Sexual Activity  . Alcohol use: Not Currently  . Drug use: Never  . Sexual activity: Not on file  Other Topics Concern  . Not on file  Social History Narrative  . Not on file   Social Determinants of Health   Financial Resource Strain: Not on file  Food Insecurity: Not on file  Transportation Needs: Not on file  Physical Activity: Not on file  Stress: Not on file  Social Connections: Not on file   Past Surgical History:  Procedure Laterality Date  . LEG SURGERY     Past Surgical History:  Procedure Laterality Date  . LEG SURGERY     Past Medical History:  Diagnosis Date  . GERD (gastroesophageal  reflux disease)    There were no vitals taken for this visit.  Opioid Risk Score:   Fall Risk Score:  `1  Depression screen PHQ 2/9  Depression screen [facility] 2/9 05/10/2020 04/11/2020 12/08/2019 10/11/2019 05/18/2019 04/27/2019  Decreased Interest 0 0 0 0 1 0  Down, Depressed, Hopeless 0 0 0 0 0 0  PHQ - 2 Score 0 0 0 0 1 0  Altered sleeping - - - - 1 -  Tired, decreased energy - - - - 1 -  Change in appetite - - - - 0 -  Feeling bad or failure about yourself  - - - - 0 -  Trouble concentrating - - - - 1 -  Moving slowly or fidgety/restless - - - - 0 -  Suicidal thoughts - - - - 0 -  PHQ-9 Score - - - - 4 -  Difficult doing work/chores - - - - Not difficult at all -   Review of Systems  Musculoskeletal: Positive for back pain and gait problem.       Left leg pain, left hip pain  All other systems reviewed and are negative.      Objective:   Physical Exam Vitals and nursing note reviewed.  Constitutional:      Appearance: Normal appearance.  Cardiovascular:  Rate and Rhythm: Normal rate and regular rhythm.     Pulses: Normal pulses.     Heart sounds: Normal heart sounds.  Pulmonary:     Effort: Pulmonary effort is normal.     Breath sounds: Normal breath sounds.  Musculoskeletal:     Cervical back: Normal range of motion and neck supple.     Comments: Normal Muscle Bulk and Muscle Testing Reveals:  Upper Extremities: Full ROM and Muscle Strength 5/5  Lower Extremities: Full ROM and Muscle Strength 5/5 Left Lower Extremity with Muscle Waisting Arises from chair with ease Narrow Based  Gait   Skin:    General: Skin is warm and dry.  Neurological:     Mental Status: He is alert and oriented to person, place, and time.  Psychiatric:        Mood and Affect: Mood normal.        Behavior: Behavior normal.           Assessment & Plan:  1. Chronic Left Common Peroneal Nerve Injury: He has Chronic left foot drop, gait abnormality and chronic neuropathic Pain:Lyrica  ineffective he reports.ContinueTo Monitor. 06/06/2020 Refilled:Oxycodone10mg one tablet3times a day as needed for pain # 90. We will continue the opioid monitoring program, this consists of regular clinic visits, examinations, urine drug screen, pill counts as well as use of [place] Controlled Substance Reporting system. A 12 month History has been reviewed on the [place] Controlled Substance Reporting Systemon04/19/2022.  F/U in 1 month

## 2020-07-07 ENCOUNTER — Encounter: Payer: Self-pay | Admitting: Registered Nurse

## 2020-07-07 ENCOUNTER — Encounter: Payer: Medicaid Other | Attending: Registered Nurse | Admitting: Registered Nurse

## 2020-07-07 ENCOUNTER — Other Ambulatory Visit: Payer: Self-pay

## 2020-07-07 VITALS — BP 151/92 | HR 61 | Temp 98.4°F | Ht 73.0 in | Wt 225.0 lb

## 2020-07-07 DIAGNOSIS — Z79899 Other long term (current) drug therapy: Secondary | ICD-10-CM

## 2020-07-07 DIAGNOSIS — M79605 Pain in left leg: Secondary | ICD-10-CM

## 2020-07-07 DIAGNOSIS — M5416 Radiculopathy, lumbar region: Secondary | ICD-10-CM

## 2020-07-07 DIAGNOSIS — G894 Chronic pain syndrome: Secondary | ICD-10-CM | POA: Diagnosis not present

## 2020-07-07 DIAGNOSIS — Z5181 Encounter for therapeutic drug level monitoring: Secondary | ICD-10-CM

## 2020-07-07 DIAGNOSIS — S8412XA Injury of peroneal nerve at lower leg level, left leg, initial encounter: Secondary | ICD-10-CM | POA: Diagnosis not present

## 2020-07-07 MED ORDER — OXYCODONE HCL 10 MG PO TABS: 10.0000 mg | ORAL_TABLET | Freq: Three times a day (TID) | ORAL | 0 refills | Status: DC | PRN

## 2020-07-07 NOTE — Progress Notes (Signed)
Subjective:    Patient ID: Juan [name], male    DOB: 12-31-1965, 55 y.o.   MRN: [id number]  HPI: Juan [name] is a 55 y.o. male who returns for follow up appointment for chronic pain and medication refill. He states his pain is located in his lower back radiating into his left lower extremity.  He rates his pain 8. His current exercise regime is walking and performing stretching exercises.  Juan [name] Morphine equivalent is 46.50  MME.  Oral Swab was Performed today.      Pain Inventory Average Pain 10 Pain Right Now 8 My pain is sharp and burning  In the last 24 hours, has pain interfered with the following? General activity 4 Relation with others 9 Enjoyment of life 10 What TIME of day is your pain at its worst? evening Sleep (in general) NA  Pain is worse with: standing and some activites Pain improves with: medication Relief from Meds: 10  History reviewed. No pertinent family history. Social History   Socioeconomic History  . Marital status: Divorced    Spouse name: Not on file  . Number of children: Not on file  . Years of education: Not on file  . Highest education level: Not on file  Occupational History  . Not on file  Tobacco Use  . Smoking status: Never Smoker  . Smokeless tobacco: Never Used  Vaping Use  . Vaping Use: Never used  Substance and Sexual Activity  . Alcohol use: Not Currently  . Drug use: Never  . Sexual activity: Not on file  Other Topics Concern  . Not on file  Social History Narrative  . Not on file   Social Determinants of Health   Financial Resource Strain: Not on file  Food Insecurity: Not on file  Transportation Needs: Not on file  Physical Activity: Not on file  Stress: Not on file  Social Connections: Not on file   Past Surgical History:  Procedure Laterality Date  . LEG SURGERY     Past Surgical History:  Procedure Laterality Date  . LEG SURGERY     Past Medical History:  Diagnosis Date  . GERD (gastroesophageal  reflux disease)    Ht 6\' 1"  (1.854 m)   Wt 225 lb (102.1 kg)   BMI 29.69 kg/m   Opioid Risk Score:   Fall Risk Score:  `1  Depression screen PHQ 2/9  Depression screen [facility] 2/9 06/06/2020 05/10/2020 04/11/2020 12/08/2019 10/11/2019 05/18/2019 04/27/2019  Decreased Interest 1 0 0 0 0 1 0  Down, Depressed, Hopeless 1 0 0 0 0 0 0  PHQ - 2 Score 2 0 0 0 0 1 0  Altered sleeping - - - - - 1 -  Tired, decreased energy - - - - - 1 -  Change in appetite - - - - - 0 -  Feeling bad or failure about yourself  - - - - - 0 -  Trouble concentrating - - - - - 1 -  Moving slowly or fidgety/restless - - - - - 0 -  Suicidal thoughts - - - - - 0 -  PHQ-9 Score - - - - - 4 -  Difficult doing work/chores - - - - - Not difficult at all -    Review of Systems  Constitutional: Negative.   Eyes: Negative.   Respiratory: Negative.   Cardiovascular: Negative.   Gastrointestinal: Negative.   Endocrine: Negative.   Genitourinary: Negative.   Musculoskeletal: Positive for back pain  and gait problem.  Skin: Negative.   Allergic/Immunologic: Negative.   Hematological: Negative.   Psychiatric/Behavioral: Negative.   All other systems reviewed and are negative.      Objective:   Physical Exam Vitals and nursing note reviewed.  Constitutional:      Appearance: Normal appearance.  Cardiovascular:     Rate and Rhythm: Normal rate and regular rhythm.     Pulses: Normal pulses.     Heart sounds: Normal heart sounds.  Pulmonary:     Effort: Pulmonary effort is normal.     Breath sounds: Normal breath sounds.  Musculoskeletal:     Cervical back: Normal range of motion and neck supple.     Comments: Normal Muscle Bulk and Muscle Testing Reveals:  Upper Extremities: Full ROM and Muscle Strength 5/5  Lumbar Paraspinal Tenderness: L-3- L-5 Lower Extremities: Full ROM and Muscle Strength on Right 5/5 Left Lower Extremity Full ROM and Muscle Waisting Noted Arise from Table Slowly  Narrow Based  Gait    Skin:    General: Skin is warm and dry.  Neurological:     Mental Status: He is alert and oriented to person, place, and time.  Psychiatric:        Mood and Affect: Mood normal.        Behavior: Behavior normal.           Assessment & Plan:  1. Chronic Left Common Peroneal Nerve Injury: He has Chronic left foot drop, gait abnormality and chronic neuropathic Pain:Lyrica ineffective he reports.ContinueTo Monitor. 07/07/2020 Refilled:Oxycodone10mg one tablet3times a day as needed for pain # 90. We will continue the opioid monitoring program, this consists of regular clinic visits, examinations, urine drug screen, pill counts as well as use of [place] Controlled Substance Reporting system. A 12 month History has been reviewed on the [place] Controlled Substance Reporting Systemon05/20/2022.  F/U in 1 month

## 2020-07-13 LAB — DRUG TOX MONITOR 1 W/CONF, ORAL FLD

## 2020-07-13 LAB — DRUG TOX ALC METAB W/CON, ORAL FLD: Alcohol Metabolite: NEGATIVE ng/mL (ref <25)

## 2020-08-04 ENCOUNTER — Other Ambulatory Visit: Payer: Self-pay

## 2020-08-04 ENCOUNTER — Encounter: Payer: Self-pay | Admitting: Registered Nurse

## 2020-08-04 ENCOUNTER — Encounter: Payer: Medicaid Other | Attending: Registered Nurse | Admitting: Registered Nurse

## 2020-08-04 VITALS — BP 132/85 | HR 62 | Temp 98.1°F | Ht 73.0 in | Wt 227.0 lb

## 2020-08-04 DIAGNOSIS — M79605 Pain in left leg: Secondary | ICD-10-CM | POA: Diagnosis present

## 2020-08-04 DIAGNOSIS — M5416 Radiculopathy, lumbar region: Secondary | ICD-10-CM

## 2020-08-04 DIAGNOSIS — Z5181 Encounter for therapeutic drug level monitoring: Secondary | ICD-10-CM

## 2020-08-04 DIAGNOSIS — G894 Chronic pain syndrome: Secondary | ICD-10-CM

## 2020-08-04 DIAGNOSIS — S8412XA Injury of peroneal nerve at lower leg level, left leg, initial encounter: Secondary | ICD-10-CM

## 2020-08-04 DIAGNOSIS — Z79899 Other long term (current) drug therapy: Secondary | ICD-10-CM

## 2020-08-04 MED ORDER — OXYCODONE HCL 10 MG PO TABS: 10.0000 mg | ORAL_TABLET | Freq: Three times a day (TID) | ORAL | 0 refills | Status: DC | PRN

## 2020-08-04 NOTE — Progress Notes (Signed)
Subjective:    Patient ID: Juan [name], male    DOB: [birth year], 55 y.o.   MRN: [id number]  HPI: Juan [name] is a 55 y.o. male who returns for follow up appointment for chronic pain and medication refill. He states his pain is located in his lower back radiating into his left lower extremity. He rates his pain 5. His current exercise regime is walking and performing stretching exercises.  [name] Morphine equivalent is 45.00  MME.   UDS was ordered today.     Pain Inventory Average Pain 8 Pain Right Now 5 My pain is constant, sharp, burning, and stabbing  In the last 24 hours, has pain interfered with the following? General activity 10 Relation with others 10 Enjoyment of life 10 What TIME of day is your pain at its worst? night Sleep (in general) Fair  Pain is worse with: walking and bending Pain improves with: medication Relief from Meds: 10  No family history on file. Social History   Socioeconomic History   Marital status: Divorced    Spouse name: Not on file   Number of children: Not on file   Years of education: Not on file   Highest education level: Not on file  Occupational History   Not on file  Tobacco Use   Smoking status: Never   Smokeless tobacco: Never  Vaping Use   Vaping Use: Never used  Substance and Sexual Activity   Alcohol use: Not Currently   Drug use: Never   Sexual activity: Not on file  Other Topics Concern   Not on file  Social History Narrative   Not on file   Social Determinants of Health   Financial Resource Strain: Not on file  Food Insecurity: Not on file  Transportation Needs: Not on file  Physical Activity: Not on file  Stress: Not on file  Social Connections: Not on file   Past Surgical History:  Procedure Laterality Date   LEG SURGERY     Past Surgical History:  Procedure Laterality Date   LEG SURGERY     Past Medical History:  Diagnosis Date   GERD (gastroesophageal reflux disease)    There were no vitals  taken for this visit.  Opioid Risk Score:   Fall Risk Score:  `1  Depression screen PHQ 2/9  Depression screen [facility] 2/9 06/06/2020 05/10/2020 04/11/2020 12/08/2019 10/11/2019 05/18/2019 04/27/2019  Decreased Interest 1 0 0 0 0 1 0  Down, Depressed, Hopeless 1 0 0 0 0 0 0  PHQ - 2 Score 2 0 0 0 0 1 0  Altered sleeping - - - - - 1 -  Tired, decreased energy - - - - - 1 -  Change in appetite - - - - - 0 -  Feeling bad or failure about yourself  - - - - - 0 -  Trouble concentrating - - - - - 1 -  Moving slowly or fidgety/restless - - - - - 0 -  Suicidal thoughts - - - - - 0 -  PHQ-9 Score - - - - - 4 -  Difficult doing work/chores - - - - - Not difficult at all -    Review of Systems  Musculoskeletal:  Positive for back pain.       Pain in left arm & left leg  All other systems reviewed and are negative.     Objective:   Physical Exam Vitals and nursing note reviewed.  Constitutional:  Appearance: Normal appearance.  Cardiovascular:     Rate and Rhythm: Normal rate and regular rhythm.     Pulses: Normal pulses.     Heart sounds: Normal heart sounds.  Pulmonary:     Effort: Pulmonary effort is normal.     Breath sounds: Normal breath sounds.  Musculoskeletal:     Cervical back: Normal range of motion and neck supple.     Comments: Normal Muscle Bulk and Muscle Testing Reveals:  Upper Extremities: Full ROM and Muscle Strength 5/5 Lower Extremities : Right: Full ROM and Muscle Strength 5/5 Left Lower Extremity: Decreased ROM and Muscle Waisting Noted Arises from Table with ease Narrow Based Gait      Skin:    General: Skin is warm and dry.  Neurological:     Mental Status: He is alert and oriented to person, place, and time.  Psychiatric:        Mood and Affect: Mood normal.        Behavior: Behavior normal.         Assessment & Plan:  1. Chronic Left Common Peroneal Nerve Injury: He has Chronic left foot drop, gait abnormality and chronic neuropathic Pain: Lyrica  ineffective he reports. Continue  To Monitor.  08/04/2020 Refilled:  Oxycodone 10 mg one tablet 3 times a day as needed for pain # 90.   We will continue the opioid monitoring program, this consists of regular clinic visits, examinations, urine drug screen, pill counts as well as use of [place] Controlled Substance Reporting system. A 12 month History has been reviewed on the [place] Controlled Substance Reporting System on 08/04/2020.    F/U in 1 month

## 2020-08-12 LAB — TOXASSURE SELECT,+ANTIDEPR,UR

## 2020-08-14 ENCOUNTER — Telehealth: Payer: Self-pay | Admitting: *Deleted

## 2020-08-14 NOTE — Telephone Encounter (Signed)
Urine drug screen for this encounter is consistent for prescribed medication 

## 2020-09-04 ENCOUNTER — Encounter: Payer: Medicaid Other | Admitting: Registered Nurse

## 2020-09-04 ENCOUNTER — Telehealth: Payer: Self-pay | Admitting: Physical Medicine & Rehabilitation

## 2020-09-04 NOTE — Telephone Encounter (Signed)
Due to Juan [name] out sick please send refill for Oxycodone.

## 2020-09-05 MED ORDER — OXYCODONE HCL 10 MG PO TABS: 10.0000 mg | ORAL_TABLET | Freq: Three times a day (TID) | ORAL | 0 refills | Status: DC | PRN

## 2020-09-05 NOTE — Telephone Encounter (Signed)
PMP was Reviewed:  Oxycodone e-scribed today.  Placed a call to Mr. Ticas, he is aware of the above and verbalizes understanding.

## 2020-09-25 ENCOUNTER — Ambulatory Visit: Payer: Medicaid Other | Admitting: Registered Nurse

## 2020-09-29 ENCOUNTER — Encounter: Payer: Medicaid Other | Attending: Registered Nurse | Admitting: Registered Nurse

## 2020-09-29 DIAGNOSIS — Z5181 Encounter for therapeutic drug level monitoring: Secondary | ICD-10-CM | POA: Insufficient documentation

## 2020-09-29 DIAGNOSIS — M79605 Pain in left leg: Secondary | ICD-10-CM | POA: Insufficient documentation

## 2020-09-29 DIAGNOSIS — S8412XA Injury of peroneal nerve at lower leg level, left leg, initial encounter: Secondary | ICD-10-CM | POA: Insufficient documentation

## 2020-09-29 DIAGNOSIS — Z79899 Other long term (current) drug therapy: Secondary | ICD-10-CM | POA: Insufficient documentation

## 2020-09-29 DIAGNOSIS — M5416 Radiculopathy, lumbar region: Secondary | ICD-10-CM | POA: Insufficient documentation

## 2020-09-29 DIAGNOSIS — G894 Chronic pain syndrome: Secondary | ICD-10-CM | POA: Insufficient documentation

## 2020-09-29 NOTE — Progress Notes (Deleted)
   Subjective:    Patient ID: Juan [name], male    DOB: [birth year], 55 y.o.   MRN: [id number]  HPI Pain Inventory Average Pain {NUMBERS; 0-10:5044} Pain Right Now {NUMBERS; 0-10:5044} My pain is {PAIN DESCRIPTION:21022940}  In the last 24 hours, has pain interfered with the following? General activity {NUMBERS; 0-10:5044} Relation with others {NUMBERS; 0-10:5044} Enjoyment of life {NUMBERS; 0-10:5044} What TIME of day is your pain at its worst? {time of day:24191} Sleep (in general) {BHH GOOD/FAIR/POOR:22877}  Pain is worse with: {ACTIVITIES:21022942} Pain improves with: {PAIN IMPROVES WVPX:10626948} Relief from Meds: {NUMBERS; 0-10:5044}  No family history on file. Social History   Socioeconomic History   Marital status: Divorced    Spouse name: Not on file   Number of children: Not on file   Years of education: Not on file   Highest education level: Not on file  Occupational History   Not on file  Tobacco Use   Smoking status: Never   Smokeless tobacco: Never  Vaping Use   Vaping Use: Never used  Substance and Sexual Activity   Alcohol use: Not Currently   Drug use: Never   Sexual activity: Not on file  Other Topics Concern   Not on file  Social History Narrative   Not on file   Social Determinants of Health   Financial Resource Strain: Not on file  Food Insecurity: Not on file  Transportation Needs: Not on file  Physical Activity: Not on file  Stress: Not on file  Social Connections: Not on file   Past Surgical History:  Procedure Laterality Date   LEG SURGERY     Past Surgical History:  Procedure Laterality Date   LEG SURGERY     Past Medical History:  Diagnosis Date   GERD (gastroesophageal reflux disease)    There were no vitals taken for this visit.  Opioid Risk Score:   Fall Risk Score:  `1  Depression screen PHQ 2/9  Depression screen [facility] 2/9 08/04/2020 06/06/2020 05/10/2020 04/11/2020 12/08/2019 10/11/2019 05/18/2019  Decreased Interest 0  1 0 0 0 0 1  Down, Depressed, Hopeless 0 1 0 0 0 0 0  PHQ - 2 Score 0 2 0 0 0 0 1  Altered sleeping - - - - - - 1  Tired, decreased energy - - - - - - 1  Change in appetite - - - - - - 0  Feeling bad or failure about yourself  - - - - - - 0  Trouble concentrating - - - - - - 1  Moving slowly or fidgety/restless - - - - - - 0  Suicidal thoughts - - - - - - 0  PHQ-9 Score - - - - - - 4  Difficult doing work/chores - - - - - - Not difficult at all      Review of Systems     Objective:   Physical Exam        Assessment & Plan:

## 2020-10-10 ENCOUNTER — Other Ambulatory Visit: Payer: Self-pay

## 2020-10-10 ENCOUNTER — Telehealth: Payer: Self-pay | Admitting: Registered Nurse

## 2020-10-10 ENCOUNTER — Encounter: Payer: Self-pay | Admitting: Registered Nurse

## 2020-10-10 ENCOUNTER — Encounter (HOSPITAL_BASED_OUTPATIENT_CLINIC_OR_DEPARTMENT_OTHER): Payer: Medicaid Other | Admitting: Registered Nurse

## 2020-10-10 VITALS — BP 132/85 | Temp 98.0°F | Ht 73.0 in | Wt 225.0 lb

## 2020-10-10 DIAGNOSIS — G894 Chronic pain syndrome: Secondary | ICD-10-CM | POA: Diagnosis not present

## 2020-10-10 DIAGNOSIS — S8412XA Injury of peroneal nerve at lower leg level, left leg, initial encounter: Secondary | ICD-10-CM | POA: Diagnosis not present

## 2020-10-10 DIAGNOSIS — M79605 Pain in left leg: Secondary | ICD-10-CM

## 2020-10-10 DIAGNOSIS — Z5181 Encounter for therapeutic drug level monitoring: Secondary | ICD-10-CM

## 2020-10-10 DIAGNOSIS — M5416 Radiculopathy, lumbar region: Secondary | ICD-10-CM | POA: Diagnosis not present

## 2020-10-10 DIAGNOSIS — Z79899 Other long term (current) drug therapy: Secondary | ICD-10-CM

## 2020-10-10 MED ORDER — OXYCODONE HCL 10 MG PO TABS: 10.0000 mg | ORAL_TABLET | Freq: Three times a day (TID) | ORAL | 0 refills | Status: DC | PRN

## 2020-10-10 NOTE — Progress Notes (Signed)
Subjective:    Patient ID: Juan [name], male    DOB: 1965-09-25, 55 y.o.   MRN: [id number]  HPI: Juan [name] is a 55 y.o. male whose appointment was changed to a My-Chart Video visit, Mr. Kean reports he was diagnoses with COVID. He agrees with My-Chart Video visit and verbalizes understanding. He states his pain is located in his lower back radiating into his left lower extremity. He rates his pain 7. His current exercise regime is walking and performing stretching exercises.   Mr. Bautch Morphine equivalent is 37.50 MME.   Last UDS was Performed on 08/04/2020, it was consistent.    Pain Inventory Average Pain 9 Pain Right Now 7 My pain is intermittent, sharp, burning, dull, stabbing, tingling, and aching  In the last 24 hours, has pain interfered with the following? General activity 9 Relation with others 0 Enjoyment of life 3 What TIME of day is your pain at its worst? night Sleep (in general) Good  Pain is worse with: walking, bending, sitting, inactivity, standing, and some activites Pain improves with: medication Relief from Meds: 10  No family history on file. Social History   Socioeconomic History   Marital status: Divorced    Spouse name: Not on file   Number of children: Not on file   Years of education: Not on file   Highest education level: Not on file  Occupational History   Not on file  Tobacco Use   Smoking status: Never   Smokeless tobacco: Never  Vaping Use   Vaping Use: Never used  Substance and Sexual Activity   Alcohol use: Not Currently   Drug use: Never   Sexual activity: Not on file  Other Topics Concern   Not on file  Social History Narrative   Not on file   Social Determinants of Health   Financial Resource Strain: Not on file  Food Insecurity: Not on file  Transportation Needs: Not on file  Physical Activity: Not on file  Stress: Not on file  Social Connections: Not on file   Past Surgical History:  Procedure Laterality Date    LEG SURGERY     Past Surgical History:  Procedure Laterality Date   LEG SURGERY     Past Medical History:  Diagnosis Date   GERD (gastroesophageal reflux disease)    BP 132/85 Comment: video visr today-last b/p on 08/04/20  Temp 98 F (36.7 C)   Ht 6\' 1"  (1.854 m)   Wt 225 lb (102.1 kg)   BMI 29.69 kg/m   Opioid Risk Score:   Fall Risk Score:  `1  Depression screen PHQ 2/9  Depression screen [facility] 2/9 08/04/2020 06/06/2020 05/10/2020 04/11/2020 12/08/2019 10/11/2019 05/18/2019  Decreased Interest 0 1 0 0 0 0 1  Down, Depressed, Hopeless 0 1 0 0 0 0 0  PHQ - 2 Score 0 2 0 0 0 0 1  Altered sleeping - - - - - - 1  Tired, decreased energy - - - - - - 1  Change in appetite - - - - - - 0  Feeling bad or failure about yourself  - - - - - - 0  Trouble concentrating - - - - - - 1  Moving slowly or fidgety/restless - - - - - - 0  Suicidal thoughts - - - - - - 0  PHQ-9 Score - - - - - - 4  Difficult doing work/chores - - - - - - Not difficult at all  Review of Systems  Musculoskeletal:  Positive for back pain. Negative for gait problem.       Left lower leg  All other systems reviewed and are negative.     Objective:   Physical Exam Vitals and nursing note reviewed.  Musculoskeletal:     Comments: No Physical Exam Performed : My-Chart Video Visit         Assessment & Plan:  1. Chronic Left Common Peroneal Nerve Injury: He has Chronic left foot drop, gait abnormality and chronic neuropathic Pain: Lyrica ineffective he reports. Continue  To Monitor.  10/10/2020 Refilled:  Oxycodone 10 mg one tablet 3 times a day as needed for pain # 90.   We will continue the opioid monitoring program, this consists of regular clinic visits, examinations, urine drug screen, pill counts as well as use of [place] Controlled Substance Reporting system. A 12 month History has been reviewed on the [place] Controlled Substance Reporting System on 10/10/2020. 2. Left Lumbar Radiculitis:  Continue HEP as Tolerated. Continue to Monitor.    F/U in 1 month  MY- Chart Video Visit Established Patient Location of Patient: In His Car Location of Provider: In the Office

## 2020-10-10 NOTE — Telephone Encounter (Signed)
Patient didn't show up for his appointment on 8/12 and he is rescheduled for this Friday 8/26 and is out of medication Oxycodone.  He said he ran out yesterday.

## 2020-10-10 NOTE — Telephone Encounter (Signed)
Mr. Derrell Lolling was called, he is scheduled for a My-Chart visit today.

## 2020-10-13 ENCOUNTER — Encounter: Payer: Medicaid Other | Admitting: Registered Nurse

## 2020-11-02 ENCOUNTER — Encounter: Payer: Medicaid Other | Attending: Registered Nurse | Admitting: Registered Nurse

## 2020-11-02 ENCOUNTER — Encounter: Payer: Self-pay | Admitting: Registered Nurse

## 2020-11-02 ENCOUNTER — Other Ambulatory Visit: Payer: Self-pay

## 2020-11-02 VITALS — BP 143/81 | HR 60 | Temp 98.0°F | Ht 73.0 in | Wt 227.4 lb

## 2020-11-02 DIAGNOSIS — G894 Chronic pain syndrome: Secondary | ICD-10-CM | POA: Insufficient documentation

## 2020-11-02 DIAGNOSIS — M5416 Radiculopathy, lumbar region: Secondary | ICD-10-CM | POA: Insufficient documentation

## 2020-11-02 DIAGNOSIS — Z79899 Other long term (current) drug therapy: Secondary | ICD-10-CM | POA: Insufficient documentation

## 2020-11-02 DIAGNOSIS — Z5181 Encounter for therapeutic drug level monitoring: Secondary | ICD-10-CM | POA: Insufficient documentation

## 2020-11-02 DIAGNOSIS — M79605 Pain in left leg: Secondary | ICD-10-CM | POA: Diagnosis not present

## 2020-11-02 DIAGNOSIS — S8412XA Injury of peroneal nerve at lower leg level, left leg, initial encounter: Secondary | ICD-10-CM | POA: Diagnosis not present

## 2020-11-02 MED ORDER — OXYCODONE HCL 10 MG PO TABS: 10.0000 mg | ORAL_TABLET | Freq: Three times a day (TID) | ORAL | 0 refills | Status: DC | PRN

## 2020-11-02 NOTE — Progress Notes (Signed)
Subjective:    Patient ID: Juan [name], male    DOB: 05/18/1965, 55 y.o.   MRN: [id number]  HPI: Juan [name] is a 55 y.o. male who returns for follow up appointment for chronic pain and medication refill. He states his  pain is located in his lower back radiating into his left lower extremity .He rates his pain 8. His current exercise regime is walking and performing stretching exercises.  Juan [name] Morphine equivalent is 45.00 MME.   Last UDS was Performed on 08/04/2020, it was consistent.   Pain Inventory Average Pain 8 Pain Right Now 8 My pain is sharp, stabbing, and aching  In the last 24 hours, has pain interfered with the following? General activity 10 Relation with others 10 Enjoyment of life 10 What TIME of day is your pain at its worst? evening Sleep (in general) Fair  Pain is worse with: standing and unsure Pain improves with: heat/ice and medication Relief from Meds: 5  No family history on file. Social History   Socioeconomic History   Marital status: Divorced    Spouse name: Not on file   Number of children: Not on file   Years of education: Not on file   Highest education level: Not on file  Occupational History   Not on file  Tobacco Use   Smoking status: Never   Smokeless tobacco: Never  Vaping Use   Vaping Use: Never used  Substance and Sexual Activity   Alcohol use: Not Currently   Drug use: Never   Sexual activity: Not on file  Other Topics Concern   Not on file  Social History Narrative   Not on file   Social Determinants of Health   Financial Resource Strain: Not on file  Food Insecurity: Not on file  Transportation Needs: Not on file  Physical Activity: Not on file  Stress: Not on file  Social Connections: Not on file   Past Surgical History:  Procedure Laterality Date   LEG SURGERY     Past Surgical History:  Procedure Laterality Date   LEG SURGERY     Past Medical History:  Diagnosis Date   GERD (gastroesophageal reflux  disease)    BP (!) 143/81   Pulse 60   Temp 98 F (36.7 C) (Oral)   Ht 6\' 1"  (1.854 m)   Wt 227 lb 6.4 oz (103.1 kg)   SpO2 98%   BMI 30.00 kg/m   Opioid Risk Score:   Fall Risk Score:  `1  Depression screen PHQ 2/9  Depression screen [facility] 2/9 10/10/2020 08/04/2020 06/06/2020 05/10/2020 04/11/2020 12/08/2019 10/11/2019  Decreased Interest 0 0 1 0 0 0 0  Down, Depressed, Hopeless 0 0 1 0 0 0 0  PHQ - 2 Score 0 0 2 0 0 0 0  Altered sleeping - - - - - - -  Tired, decreased energy - - - - - - -  Change in appetite - - - - - - -  Feeling bad or failure about yourself  - - - - - - -  Trouble concentrating - - - - - - -  Moving slowly or fidgety/restless - - - - - - -  Suicidal thoughts - - - - - - -  PHQ-9 Score - - - - - - -  Difficult doing work/chores - - - - - - -     Review of Systems  Musculoskeletal:        Leg pain  All  other systems reviewed and are negative.     Objective:   Physical Exam Vitals and nursing note reviewed.  Constitutional:      Appearance: Normal appearance.  Cardiovascular:     Rate and Rhythm: Normal rate and regular rhythm.     Pulses: Normal pulses.     Heart sounds: Normal heart sounds.  Pulmonary:     Effort: Pulmonary effort is normal.     Breath sounds: Normal breath sounds.  Musculoskeletal:     Cervical back: Normal range of motion and neck supple.     Comments: Normal Muscle Bulk and Muscle Testing Reveals:  Upper Extremities: Full ROM and Muscle Strength  5/5 Lumbar Paraspinal Tenderness: L-3-L-5 Lower Extremities: Full ROM and Muscle Strength 5/5 Left Lower Extremity with Muscle Waisting Arises from Table Slowly using cane  Narrow Based  Gait     Skin:    General: Skin is warm and dry.  Neurological:     Mental Status: He is alert and oriented to person, place, and time.  Psychiatric:        Mood and Affect: Mood normal.        Behavior: Behavior normal.         Assessment & Plan:  1. Chronic Left Common Peroneal Nerve  Injury: He has Chronic left foot drop, gait abnormality and chronic neuropathic Pain: Lyrica ineffective he reports. Continue  To Monitor.  11/02/2020 Refilled:  Oxycodone 10 mg one tablet 3 times a day as needed for pain # 90.   We will continue the opioid monitoring program, this consists of regular clinic visits, examinations, urine drug screen, pill counts as well as use of [place] Controlled Substance Reporting system. A 12 month History has been reviewed on the [place] Controlled Substance Reporting System on 0915/2022. 2. Left Lumbar Radiculitis: Continue HEP as Tolerated. Continue to Monitor.    F/U in 1 month

## 2020-12-07 ENCOUNTER — Other Ambulatory Visit: Payer: Self-pay

## 2020-12-07 ENCOUNTER — Encounter: Payer: Self-pay | Admitting: Registered Nurse

## 2020-12-07 ENCOUNTER — Encounter: Payer: Medicaid Other | Attending: Registered Nurse | Admitting: Registered Nurse

## 2020-12-07 VITALS — BP 138/85 | HR 61 | Ht 73.0 in | Wt 224.6 lb

## 2020-12-07 DIAGNOSIS — G894 Chronic pain syndrome: Secondary | ICD-10-CM | POA: Diagnosis not present

## 2020-12-07 DIAGNOSIS — Z5181 Encounter for therapeutic drug level monitoring: Secondary | ICD-10-CM | POA: Diagnosis present

## 2020-12-07 DIAGNOSIS — Z79899 Other long term (current) drug therapy: Secondary | ICD-10-CM | POA: Diagnosis present

## 2020-12-07 DIAGNOSIS — M79605 Pain in left leg: Secondary | ICD-10-CM | POA: Diagnosis present

## 2020-12-07 DIAGNOSIS — S8412XA Injury of peroneal nerve at lower leg level, left leg, initial encounter: Secondary | ICD-10-CM | POA: Insufficient documentation

## 2020-12-07 DIAGNOSIS — M5416 Radiculopathy, lumbar region: Secondary | ICD-10-CM | POA: Diagnosis present

## 2020-12-07 MED ORDER — OXYCODONE HCL 10 MG PO TABS: 10.0000 mg | ORAL_TABLET | Freq: Three times a day (TID) | ORAL | 0 refills | Status: DC | PRN

## 2020-12-07 NOTE — Progress Notes (Signed)
Subjective:    Patient ID: Juan [name], male    DOB: [birth year], 55 y.o.   MRN: [id number]  HPI: Juan [name] is a 55 y.o. male who returns for follow up appointment for chronic pain and medication refill. He states his  pain is located in his lower back radiating into his left lower extremity. He rates his pain 9. His current exercise regime is walking and performing stretching exercises.  Juan [name] Morphine equivalent is 45.00 MME.   UDS ordered today.     Pain Inventory Average Pain 9 Pain Right Now 9 My pain is intermittent and sharp  In the last 24 hours, has pain interfered with the following? General activity 3 Relation with others 3 Enjoyment of life 3 What TIME of day is your pain at its worst? night Sleep (in general) Poor  Pain is worse with: walking and some activites Pain improves with: medication Relief from Meds: 10  History reviewed. No pertinent family history. Social History   Socioeconomic History   Marital status: Divorced    Spouse name: Not on file   Number of children: Not on file   Years of education: Not on file   Highest education level: Not on file  Occupational History   Not on file  Tobacco Use   Smoking status: Never   Smokeless tobacco: Never  Vaping Use   Vaping Use: Never used  Substance and Sexual Activity   Alcohol use: Not Currently   Drug use: Never   Sexual activity: Not on file  Other Topics Concern   Not on file  Social History Narrative   Not on file   Social Determinants of Health   Financial Resource Strain: Not on file  Food Insecurity: Not on file  Transportation Needs: Not on file  Physical Activity: Not on file  Stress: Not on file  Social Connections: Not on file   Past Surgical History:  Procedure Laterality Date   LEG SURGERY     Past Surgical History:  Procedure Laterality Date   LEG SURGERY     Past Medical History:  Diagnosis Date   GERD (gastroesophageal reflux disease)    Ht 6\' 1"  (1.854 m)    Wt 224 lb 9.6 oz (101.9 kg)   BMI 29.63 kg/m   Opioid Risk Score:   Fall Risk Score:  `1  Depression screen PHQ 2/9  Depression screen [facility] 2/9 12/07/2020 10/10/2020 08/04/2020 06/06/2020 05/10/2020 04/11/2020 12/08/2019  Decreased Interest 0 0 0 1 0 0 0  Down, Depressed, Hopeless 0 0 0 1 0 0 0  PHQ - 2 Score 0 0 0 2 0 0 0  Altered sleeping - - - - - - -  Tired, decreased energy - - - - - - -  Change in appetite - - - - - - -  Feeling bad or failure about yourself  - - - - - - -  Trouble concentrating - - - - - - -  Moving slowly or fidgety/restless - - - - - - -  Suicidal thoughts - - - - - - -  PHQ-9 Score - - - - - - -  Difficult doing work/chores - - - - - - -     Review of Systems  Constitutional: Negative.   HENT: Negative.    Eyes: Negative.   Respiratory: Negative.    Cardiovascular: Negative.   Gastrointestinal: Negative.   Endocrine: Negative.   Genitourinary: Negative.   Musculoskeletal: Negative.  Skin: Negative.   Allergic/Immunologic: Negative.   Neurological: Negative.   Hematological: Negative.   Psychiatric/Behavioral: Negative.        Objective:   Physical Exam Vitals and nursing note reviewed.  Constitutional:      Appearance: Normal appearance.  Cardiovascular:     Rate and Rhythm: Normal rate and regular rhythm.     Pulses: Normal pulses.     Heart sounds: Normal heart sounds.  Pulmonary:     Effort: Pulmonary effort is normal.     Breath sounds: Normal breath sounds.  Musculoskeletal:     Cervical back: Normal range of motion and neck supple.     Comments: Normal Muscle Bulk and Muscle Testing Reveals:  Upper Extremities: Full ROM and Muscle Strength 5/5 Lower Extremities:  Full ROM and Muscle Strength 5/5 Left Lower Extremity with Muscle Waisting Note Arises from Table with ease Narrow Based Gait     Skin:    General: Skin is warm and dry.  Neurological:     Mental Status: He is alert and oriented to person, place, and time.   Psychiatric:        Mood and Affect: Mood normal.        Behavior: Behavior normal.         Assessment & Plan:  1. Chronic Left Common Peroneal Nerve Injury: He has Chronic left foot drop, gait abnormality and chronic neuropathic Pain: Lyrica ineffective he reports. Continue  To Monitor.  12/07/2020 Refilled:  Oxycodone 10 mg one tablet 3 times a day as needed for pain # 90.   We will continue the opioid monitoring program, this consists of regular clinic visits, examinations, urine drug screen, pill counts as well as use of [place] Controlled Substance Reporting system. A 12 month History has been reviewed on the [place] Controlled Substance Reporting System on 12/07/2020. 2. Left Lumbar Radiculitis: Continue HEP as Tolerated. Ontinue current medication regimen. Continue to Monitor. 12/07/2020   F/U in 1 month

## 2020-12-15 LAB — TOXASSURE SELECT,+ANTIDEPR,UR

## 2020-12-20 ENCOUNTER — Telehealth: Payer: Self-pay | Admitting: *Deleted

## 2020-12-20 NOTE — Telephone Encounter (Signed)
Urine drug screen for this encounter is consistent for prescribed medication 

## 2021-01-08 ENCOUNTER — Encounter: Payer: Medicaid Other | Attending: Registered Nurse | Admitting: Registered Nurse

## 2021-01-08 ENCOUNTER — Encounter: Payer: Self-pay | Admitting: Registered Nurse

## 2021-01-08 ENCOUNTER — Other Ambulatory Visit: Payer: Self-pay

## 2021-01-08 VITALS — BP 138/93 | HR 67 | Temp 97.8°F | Ht 73.0 in | Wt 231.0 lb

## 2021-01-08 DIAGNOSIS — M5416 Radiculopathy, lumbar region: Secondary | ICD-10-CM | POA: Insufficient documentation

## 2021-01-08 DIAGNOSIS — Z5181 Encounter for therapeutic drug level monitoring: Secondary | ICD-10-CM | POA: Insufficient documentation

## 2021-01-08 DIAGNOSIS — S8412XA Injury of peroneal nerve at lower leg level, left leg, initial encounter: Secondary | ICD-10-CM | POA: Insufficient documentation

## 2021-01-08 DIAGNOSIS — M79605 Pain in left leg: Secondary | ICD-10-CM | POA: Diagnosis present

## 2021-01-08 DIAGNOSIS — Z79899 Other long term (current) drug therapy: Secondary | ICD-10-CM | POA: Diagnosis present

## 2021-01-08 DIAGNOSIS — G894 Chronic pain syndrome: Secondary | ICD-10-CM | POA: Diagnosis present

## 2021-01-08 MED ORDER — OXYCODONE HCL 10 MG PO TABS: 10.0000 mg | ORAL_TABLET | Freq: Three times a day (TID) | ORAL | 0 refills | Status: DC | PRN

## 2021-01-08 NOTE — Progress Notes (Signed)
Subjective:    Patient ID: Juan [name], male    DOB: 08-12-1965, 55 y.o.   MRN: [id number]  HPI: Juan [name] is a 55 y.o. male who returns for follow up appointment for chronic pain and medication refill. He states  his pain is located in his lower backradiating ino his left lower extremity. He rates his pain 7. His current exercise regime is walking.  Juan [name] forgot his medication bottle today, he was educated on the narcotic policy. He was instructed to bring his medication bottle to every visit, he realizes if this occurs again. He will have to bring his medication bottle to office before a prescription will be e-scribed. He verbalizes understanding.   Juan [name] Morphine equivalent is 42.00  MME.   Last UDS was Performed on 12/07/2020, it was consistent.      Pain Inventory Average Pain 7 Pain Right Now 7 My pain is constant, sharp, and stabbing  In the last 24 hours, has pain interfered with the following? General activity 9 Relation with others 10 Enjoyment of life 10 What TIME of day is your pain at its worst? evening and night Sleep (in general) Fair  Pain is worse with: standing and some activites Pain improves with: medication Relief from Meds: 10  History reviewed. No pertinent family history. Social History   Socioeconomic History   Marital status: Divorced    Spouse name: Not on file   Number of children: Not on file   Years of education: Not on file   Highest education level: Not on file  Occupational History   Not on file  Tobacco Use   Smoking status: Never   Smokeless tobacco: Never  Vaping Use   Vaping Use: Never used  Substance and Sexual Activity   Alcohol use: Not Currently   Drug use: Never   Sexual activity: Not on file  Other Topics Concern   Not on file  Social History Narrative   Not on file   Social Determinants of Health   Financial Resource Strain: Not on file  Food Insecurity: Not on file  Transportation Needs: Not on file   Physical Activity: Not on file  Stress: Not on file  Social Connections: Not on file   Past Surgical History:  Procedure Laterality Date   LEG SURGERY     Past Surgical History:  Procedure Laterality Date   LEG SURGERY     Past Medical History:  Diagnosis Date   GERD (gastroesophageal reflux disease)    BP (!) 148/88   Pulse 67   Temp 97.8 F (36.6 C)   Ht 6\' 1"  (1.854 m)   Wt 231 lb (104.8 kg)   SpO2 97%   BMI 30.48 kg/m   Opioid Risk Score:   Fall Risk Score:  `1  Depression screen PHQ 2/9  Depression screen [facility] 2/9 01/08/2021 12/07/2020 10/10/2020 08/04/2020 06/06/2020 05/10/2020 04/11/2020  Decreased Interest 0 0 0 0 1 0 0  Down, Depressed, Hopeless 0 0 0 0 1 0 0  PHQ - 2 Score 0 0 0 0 2 0 0  Altered sleeping - - - - - - -  Tired, decreased energy - - - - - - -  Change in appetite - - - - - - -  Feeling bad or failure about yourself  - - - - - - -  Trouble concentrating - - - - - - -  Moving slowly or fidgety/restless - - - - - - -  Suicidal thoughts - - - - - - -  PHQ-9 Score - - - - - - -  Difficult doing work/chores - - - - - - -    Review of Systems  Musculoskeletal:  Positive for back pain and gait problem.       Pain in right side of body, shoulder, arm, back & leg      Objective:   Physical Exam Vitals and nursing note reviewed.  Constitutional:      Appearance: Normal appearance.  Cardiovascular:     Rate and Rhythm: Normal rate and regular rhythm.     Pulses: Normal pulses.     Heart sounds: Normal heart sounds.  Pulmonary:     Effort: Pulmonary effort is normal.     Breath sounds: Normal breath sounds.  Musculoskeletal:     Cervical back: Normal range of motion and neck supple.     Comments: Normal Muscle Bulk and Muscle Testing Reveals:  Upper Extremities: Full ROM and Muscle Strength 5/5  Lower Extremities: Full ROM and Muscle Strength 5/5 Arises from Table with ease Narrow Based  Gait     Skin:    General: Skin is warm and dry.   Neurological:     Mental Status: He is alert and oriented to person, place, and time.  Psychiatric:        Mood and Affect: Mood normal.        Behavior: Behavior normal.         Assessment & Plan:  1. Chronic Left Common Peroneal Nerve Injury: He has Chronic left foot drop, gait abnormality and chronic neuropathic Pain: Lyrica ineffective he reports. Continue  To Monitor.  01/08/2021 Refilled:  Oxycodone 10 mg one tablet 3 times a day as needed for pain # 90.   We will continue the opioid monitoring program, this consists of regular clinic visits, examinations, urine drug screen, pill counts as well as use of [place] Controlled Substance Reporting system. A 12 month History has been reviewed on the [place] Controlled Substance Reporting System on 01/08/2021. 2. Left Lumbar Radiculitis: Continue HEP as Tolerated. Ontinue current medication regimen. Continue to Monitor. 01/08/2021   F/U in 1 month

## 2021-02-06 ENCOUNTER — Encounter: Payer: Medicaid Other | Attending: Registered Nurse | Admitting: Registered Nurse

## 2021-02-06 ENCOUNTER — Encounter: Payer: Self-pay | Admitting: Registered Nurse

## 2021-02-06 ENCOUNTER — Other Ambulatory Visit: Payer: Self-pay

## 2021-02-06 VITALS — BP 134/81 | HR 63 | Temp 98.0°F | Ht 73.0 in | Wt 228.0 lb

## 2021-02-06 DIAGNOSIS — Z5181 Encounter for therapeutic drug level monitoring: Secondary | ICD-10-CM

## 2021-02-06 DIAGNOSIS — S8412XA Injury of peroneal nerve at lower leg level, left leg, initial encounter: Secondary | ICD-10-CM | POA: Diagnosis not present

## 2021-02-06 DIAGNOSIS — M5416 Radiculopathy, lumbar region: Secondary | ICD-10-CM

## 2021-02-06 DIAGNOSIS — Z79899 Other long term (current) drug therapy: Secondary | ICD-10-CM

## 2021-02-06 DIAGNOSIS — M79605 Pain in left leg: Secondary | ICD-10-CM

## 2021-02-06 DIAGNOSIS — G894 Chronic pain syndrome: Secondary | ICD-10-CM

## 2021-02-06 MED ORDER — OXYCODONE HCL 10 MG PO TABS: 10.0000 mg | ORAL_TABLET | Freq: Three times a day (TID) | ORAL | 0 refills | Status: DC | PRN

## 2021-02-06 NOTE — Progress Notes (Signed)
Subjective:    Patient ID: Juan [name], male    DOB: 1965/10/15, 55 y.o.   MRN: [id number]  HPI: Juan [name] is a 55 y.o. male who returns for follow up appointment for chronic pain and medication refill. He states his pain is located in his lower back radiating into his left lower extremity. He rates his pain 7. His current exercise regime is walking and performing stretching exercises.  Mr. Febo Morphine equivalent is 45.00  MME.   Last UDS was Performed on 12/07/2020, it was consistent.    Pain Inventory Average Pain 9 Pain Right Now 7 My pain is constant, sharp, and stabbing  In the last 24 hours, has pain interfered with the following? General activity 10 Relation with others 10 Enjoyment of life 10 What TIME of day is your pain at its worst? evening Sleep (in general) Fair  Pain is worse with: some activites Pain improves with: medication Relief from Meds: 10  No family history on file. Social History   Socioeconomic History   Marital status: Divorced    Spouse name: Not on file   Number of children: Not on file   Years of education: Not on file   Highest education level: Not on file  Occupational History   Not on file  Tobacco Use   Smoking status: Never   Smokeless tobacco: Never  Vaping Use   Vaping Use: Never used  Substance and Sexual Activity   Alcohol use: Not Currently   Drug use: Never   Sexual activity: Not on file  Other Topics Concern   Not on file  Social History Narrative   Not on file   Social Determinants of Health   Financial Resource Strain: Not on file  Food Insecurity: Not on file  Transportation Needs: Not on file  Physical Activity: Not on file  Stress: Not on file  Social Connections: Not on file   Past Surgical History:  Procedure Laterality Date   LEG SURGERY     Past Surgical History:  Procedure Laterality Date   LEG SURGERY     Past Medical History:  Diagnosis Date   GERD (gastroesophageal reflux disease)     There were no vitals taken for this visit.  Opioid Risk Score:   Fall Risk Score:  `1  Depression screen PHQ 2/9  Depression screen [facility] 2/9 01/08/2021 12/07/2020 10/10/2020 08/04/2020 06/06/2020 05/10/2020 04/11/2020  Decreased Interest 0 0 0 0 1 0 0  Down, Depressed, Hopeless 0 0 0 0 1 0 0  PHQ - 2 Score 0 0 0 0 2 0 0  Altered sleeping - - - - - - -  Tired, decreased energy - - - - - - -  Change in appetite - - - - - - -  Feeling bad or failure about yourself  - - - - - - -  Trouble concentrating - - - - - - -  Moving slowly or fidgety/restless - - - - - - -  Suicidal thoughts - - - - - - -  PHQ-9 Score - - - - - - -  Difficult doing work/chores - - - - - - -    Review of Systems  Musculoskeletal:  Positive for back pain and gait problem.       Right foot pain, left hand  All other systems reviewed and are negative.     Objective:   Physical Exam Vitals and nursing note reviewed.  Constitutional:  Appearance: Normal appearance.  Cardiovascular:     Rate and Rhythm: Normal rate and regular rhythm.     Pulses: Normal pulses.     Heart sounds: Normal heart sounds.  Pulmonary:     Effort: Pulmonary effort is normal.     Breath sounds: Normal breath sounds.  Musculoskeletal:     Cervical back: Normal range of motion and neck supple.     Comments: Normal Muscle Bulk and Muscle Testing Reveals:  Upper Extremities:Full  ROM and Muscle Strength 5/5  Lower Extremities: Full ROM and Muscle Strength 5/5 Arises from chair with ease Narrow Based  Gait     Skin:    General: Skin is warm and dry.  Neurological:     Mental Status: He is alert and oriented to person, place, and time.  Psychiatric:        Mood and Affect: Mood normal.        Behavior: Behavior normal.         Assessment & Plan:  1. Chronic Left Common Peroneal Nerve Injury: He has Chronic left foot drop, gait abnormality and chronic neuropathic Pain: Lyrica ineffective he reports. Continue  To Monitor.   02/06/2021 Refilled:  Oxycodone 10 mg one tablet 3 times a day as needed for pain # 90.   We will continue the opioid monitoring program, this consists of regular clinic visits, examinations, urine drug screen, pill counts as well as use of [place] Controlled Substance Reporting system. A 12 month History has been reviewed on the [place] Controlled Substance Reporting System on 02/06/2021. 2. Left Lumbar Radiculitis: Continue HEP as Tolerated. Ontinue current medication regimen. Continue to Monitor. 02/06/2021   F/U in 1 month

## 2021-02-13 ENCOUNTER — Encounter: Payer: Self-pay | Admitting: Registered Nurse

## 2021-03-06 ENCOUNTER — Encounter: Payer: Medicaid Other | Attending: Registered Nurse | Admitting: Registered Nurse

## 2021-03-06 ENCOUNTER — Encounter: Payer: Self-pay | Admitting: Registered Nurse

## 2021-03-06 ENCOUNTER — Other Ambulatory Visit: Payer: Self-pay

## 2021-03-06 VITALS — BP 145/80 | HR 69 | Temp 97.9°F | Ht 73.0 in | Wt 228.0 lb

## 2021-03-06 DIAGNOSIS — Z5181 Encounter for therapeutic drug level monitoring: Secondary | ICD-10-CM | POA: Diagnosis present

## 2021-03-06 DIAGNOSIS — Z79899 Other long term (current) drug therapy: Secondary | ICD-10-CM | POA: Diagnosis present

## 2021-03-06 DIAGNOSIS — M79605 Pain in left leg: Secondary | ICD-10-CM | POA: Diagnosis present

## 2021-03-06 DIAGNOSIS — M5416 Radiculopathy, lumbar region: Secondary | ICD-10-CM | POA: Insufficient documentation

## 2021-03-06 DIAGNOSIS — S8412XA Injury of peroneal nerve at lower leg level, left leg, initial encounter: Secondary | ICD-10-CM | POA: Diagnosis present

## 2021-03-06 DIAGNOSIS — G894 Chronic pain syndrome: Secondary | ICD-10-CM | POA: Insufficient documentation

## 2021-03-06 MED ORDER — OXYCODONE HCL 10 MG PO TABS: 10.0000 mg | ORAL_TABLET | Freq: Three times a day (TID) | ORAL | 0 refills | Status: DC | PRN

## 2021-03-06 NOTE — Progress Notes (Signed)
Subjective:    Patient ID: Juan [name], male    DOB: 23-Feb-1965, 56 y.o.   MRN: [id number]  HPI: Juan [name] is a 56 y.o. male who returns for follow up appointment for chronic pain and medication refill. He states his pain is located in his lower back radiating into his left lower extremity. Also reports left arm muscle pain at times. Juan [name] states he has a scheduled PCP appointment, to discuss X-rays. This provider offered to order X-rays, he states he wants to speak with his PCP. He  rates his pain 9. His current exercise regime is walking and performing stretching exercises.  Juan [name] Morphine equivalent is 45.00 MME.   UDS ordered today    Pain Inventory Average Pain 8 Pain Right Now 9 My pain is sharp and stabbing  In the last 24 hours, has pain interfered with the following? General activity 10 Relation with others 10 Enjoyment of life 10 What TIME of day is your pain at its worst? evening Sleep (in general) Poor  Pain is worse with: bending Pain improves with: medication Relief from Meds:  not sure  No family history on file. Social History   Socioeconomic History   Marital status: Divorced    Spouse name: Not on file   Number of children: Not on file   Years of education: Not on file   Highest education level: Not on file  Occupational History   Not on file  Tobacco Use   Smoking status: Never   Smokeless tobacco: Never  Vaping Use   Vaping Use: Never used  Substance and Sexual Activity   Alcohol use: Not Currently   Drug use: Never   Sexual activity: Not on file  Other Topics Concern   Not on file  Social History Narrative   Not on file   Social Determinants of Health   Financial Resource Strain: Not on file  Food Insecurity: Not on file  Transportation Needs: Not on file  Physical Activity: Not on file  Stress: Not on file  Social Connections: Not on file   Past Surgical History:  Procedure Laterality Date   LEG SURGERY     Past Surgical  History:  Procedure Laterality Date   LEG SURGERY     Past Medical History:  Diagnosis Date   GERD (gastroesophageal reflux disease)    BP (!) 145/80    Pulse 69    Temp 97.9 F (36.6 C) (Oral)    Ht 6\' 1"  (1.854 m)    Wt 228 lb (103.4 kg)    SpO2 95%    BMI 30.08 kg/m   Opioid Risk Score:   Fall Risk Score:  `1  Depression screen PHQ 2/9  Depression screen [facility] 2/9 02/06/2021 01/08/2021 12/07/2020 10/10/2020 08/04/2020 06/06/2020 05/10/2020  Decreased Interest 0 0 0 0 0 1 0  Down, Depressed, Hopeless 0 0 0 0 0 1 0  PHQ - 2 Score 0 0 0 0 0 2 0  Altered sleeping - - - - - - -  Tired, decreased energy - - - - - - -  Change in appetite - - - - - - -  Feeling bad or failure about yourself  - - - - - - -  Trouble concentrating - - - - - - -  Moving slowly or fidgety/restless - - - - - - -  Suicidal thoughts - - - - - - -  PHQ-9 Score - - - - - - -  Difficult doing work/chores - - - - - - -      Review of Systems  Constitutional: Negative.   HENT: Negative.    Eyes: Negative.   Respiratory: Negative.    Cardiovascular: Negative.   Gastrointestinal: Negative.   Endocrine: Negative.   Genitourinary: Negative.   Musculoskeletal:  Positive for gait problem.       Left arm pain , left foot pain  Skin: Negative.   Allergic/Immunologic: Negative.   Hematological: Negative.   Psychiatric/Behavioral: Negative.        Objective:   Physical Exam Vitals and nursing note reviewed.  Constitutional:      Appearance: Normal appearance.  Cardiovascular:     Rate and Rhythm: Normal rate and regular rhythm.     Pulses: Normal pulses.     Heart sounds: Normal heart sounds.  Pulmonary:     Effort: Pulmonary effort is normal.     Breath sounds: Normal breath sounds.  Musculoskeletal:     Cervical back: Normal range of motion and neck supple.     Comments: Normal Muscle Bulk and Muscle Testing Reveals:  Upper Extremities: Full ROM and Muscle Strength 5/5 Lower Extremities: Full ROM  and Muscle Strength 5/5 Arises From Table slowly using cane for support  Narrow Based  Gait     Skin:    General: Skin is warm and dry.  Neurological:     Mental Status: He is alert and oriented to person, place, and time.  Psychiatric:        Mood and Affect: Mood normal.        Behavior: Behavior normal.         Assessment & Plan:  1. Chronic Left Common Peroneal Nerve Injury: He has Chronic left foot drop, gait abnormality and chronic neuropathic Pain: Lyrica ineffective he reports. Continue  To Monitor.  03/06/2021 Refilled:  Oxycodone 10 mg one tablet 3 times a day as needed for pain # 90.   We will continue the opioid monitoring program, this consists of regular clinic visits, examinations, urine drug screen, pill counts as well as use of [place] Controlled Substance Reporting system. A 12 month History has been reviewed on the [place] Controlled Substance Reporting System on 03/06/2021. 2. Left Lumbar Radiculitis: Continue HEP as Tolerated. Ontinue current medication regimen. Continue to Monitor. 03/06/2021   F/U in 1 month

## 2021-03-09 ENCOUNTER — Ambulatory Visit: Payer: Medicaid Other | Admitting: Registered Nurse

## 2021-03-09 LAB — TOXASSURE SELECT,+ANTIDEPR,UR

## 2021-03-20 ENCOUNTER — Telehealth: Payer: Self-pay | Admitting: *Deleted

## 2021-03-20 NOTE — Telephone Encounter (Signed)
Urine drug screen for this encounter is consistent for prescribed medication 

## 2021-04-05 ENCOUNTER — Other Ambulatory Visit: Payer: Self-pay

## 2021-04-05 ENCOUNTER — Encounter: Payer: Self-pay | Admitting: Registered Nurse

## 2021-04-05 ENCOUNTER — Encounter: Payer: Medicaid Other | Attending: Registered Nurse | Admitting: Registered Nurse

## 2021-04-05 VITALS — BP 139/81 | HR 62 | Ht 73.0 in | Wt 228.0 lb

## 2021-04-05 DIAGNOSIS — S8412XA Injury of peroneal nerve at lower leg level, left leg, initial encounter: Secondary | ICD-10-CM | POA: Diagnosis not present

## 2021-04-05 DIAGNOSIS — G894 Chronic pain syndrome: Secondary | ICD-10-CM

## 2021-04-05 DIAGNOSIS — Z79899 Other long term (current) drug therapy: Secondary | ICD-10-CM

## 2021-04-05 DIAGNOSIS — Z5181 Encounter for therapeutic drug level monitoring: Secondary | ICD-10-CM | POA: Diagnosis not present

## 2021-04-05 DIAGNOSIS — M79605 Pain in left leg: Secondary | ICD-10-CM | POA: Diagnosis not present

## 2021-04-05 MED ORDER — OXYCODONE HCL 10 MG PO TABS: 10.0000 mg | ORAL_TABLET | Freq: Three times a day (TID) | ORAL | 0 refills | Status: DC | PRN

## 2021-04-05 NOTE — Progress Notes (Signed)
Subjective:    Patient ID: Juan [name], male    DOB: 03-02-1965, 56 y.o.   MRN: [id number]  HPI: Juan [name] is a 56 y.o. male who returns for follow up appointment for chronic pain and medication refill. He states his  pain is located in his left lower extremity and left foot. He rates his pain 9. His current exercise regime is walking and performing stretching exercises.  Mr.  [name] arrived bradycardic, apical pulse was checked. He will F/U with his PCP, he verbalizes understanding.   Juan [name] Morphine equivalent is 45.00 MME.   Last UDS was Performed n 03/06/2021, it was consistent.     Pain Inventory Average Pain 9 Pain Right Now 9 My pain is intermittent, burning, and aching  In the last 24 hours, has pain interfered with the following? General activity 10 Relation with others 10 Enjoyment of life 10 What TIME of day is your pain at its worst? night Sleep (in general) Good  Pain is worse with: standing Pain improves with: medication Relief from Meds: 5  No family history on file. Social History   Socioeconomic History   Marital status: Divorced    Spouse name: Not on file   Number of children: Not on file   Years of education: Not on file   Highest education level: Not on file  Occupational History   Not on file  Tobacco Use   Smoking status: Never   Smokeless tobacco: Never  Vaping Use   Vaping Use: Never used  Substance and Sexual Activity   Alcohol use: Not Currently   Drug use: Never   Sexual activity: Not on file  Other Topics Concern   Not on file  Social History Narrative   Not on file   Social Determinants of Health   Financial Resource Strain: Not on file  Food Insecurity: Not on file  Transportation Needs: Not on file  Physical Activity: Not on file  Stress: Not on file  Social Connections: Not on file   Past Surgical History:  Procedure Laterality Date   LEG SURGERY     Past Surgical History:  Procedure Laterality Date   LEG  SURGERY     Past Medical History:  Diagnosis Date   GERD (gastroesophageal reflux disease)    BP 139/81    Pulse (!) 54    Ht 6\' 1"  (1.854 m)    Wt 228 lb (103.4 kg)    SpO2 98%    BMI 30.08 kg/m   Opioid Risk Score:   Fall Risk Score:  `1  Depression screen PHQ 2/9  Depression screen [facility] 2/9 04/05/2021 03/06/2021 02/06/2021 01/08/2021 12/07/2020 10/10/2020 08/04/2020  Decreased Interest 0 0 0 0 0 0 0  Down, Depressed, Hopeless 0 0 0 0 0 0 0  PHQ - 2 Score 0 0 0 0 0 0 0  Altered sleeping - - - - - - -  Tired, decreased energy - - - - - - -  Change in appetite - - - - - - -  Feeling bad or failure about yourself  - - - - - - -  Trouble concentrating - - - - - - -  Moving slowly or fidgety/restless - - - - - - -  Suicidal thoughts - - - - - - -  PHQ-9 Score - - - - - - -  Difficult doing work/chores - - - - - - -     Review of Systems  Musculoskeletal:  Pain in left arm and leg   Neurological:  Positive for weakness and numbness.  All other systems reviewed and are negative.     Objective:   Physical Exam Vitals and nursing note reviewed.  Constitutional:      Appearance: Normal appearance.  Cardiovascular:     Rate and Rhythm: Normal rate and regular rhythm.     Pulses: Normal pulses.     Heart sounds: Normal heart sounds.  Pulmonary:     Effort: Pulmonary effort is normal.     Breath sounds: Normal breath sounds.  Musculoskeletal:     Cervical back: Normal range of motion and neck supple.     Comments: Normal Muscle Bulk and Muscle Testing Reveals:  Upper Extremities: Full ROM and Muscle Strength  5/5 Lumbar Paraspinal Tenderness: L-4-L-5 Lower Extremities: Right: Full ROM and Muscle Strength 5/5 Left Lower Extremity: Full ROM and Muscle Waisting noted Arises from Chair slowly using cane for support Narrow Based Gait     Skin:    General: Skin is warm and dry.  Neurological:     Mental Status: He is alert and oriented to person, place, and time.   Psychiatric:        Mood and Affect: Mood normal.        Behavior: Behavior normal.         Assessment & Plan:  1. Chronic Left Common Peroneal Nerve Injury: He has Chronic left foot drop, gait abnormality and chronic neuropathic Pain: Lyrica ineffective he reports. Continue  To Monitor.  04/05/2021 Refilled:  Oxycodone 10 mg one tablet 3 times a day as needed for pain # 90.   We will continue the opioid monitoring program, this consists of regular clinic visits, examinations, urine drug screen, pill counts as well as use of [place] Controlled Substance Reporting system. A 12 month History has been reviewed on the [place] Controlled Substance Reporting System on 04/05/2021. 2. Left Lumbar Radiculitis: No complaints today. Continue HEP as Tolerated. Continue current medication regimen. Continue to Monitor. 04/05/2021   F/U in 1 month

## 2021-04-09 ENCOUNTER — Ambulatory Visit: Payer: Medicaid Other | Admitting: Registered Nurse

## 2021-05-04 ENCOUNTER — Other Ambulatory Visit: Payer: Self-pay

## 2021-05-04 ENCOUNTER — Encounter: Payer: Self-pay | Admitting: Registered Nurse

## 2021-05-04 ENCOUNTER — Encounter: Payer: Medicaid Other | Attending: Registered Nurse | Admitting: Registered Nurse

## 2021-05-04 VITALS — BP 130/76 | HR 68 | Ht 73.0 in | Wt 226.0 lb

## 2021-05-04 DIAGNOSIS — Z79899 Other long term (current) drug therapy: Secondary | ICD-10-CM | POA: Diagnosis present

## 2021-05-04 DIAGNOSIS — M79605 Pain in left leg: Secondary | ICD-10-CM | POA: Diagnosis not present

## 2021-05-04 DIAGNOSIS — Z5181 Encounter for therapeutic drug level monitoring: Secondary | ICD-10-CM

## 2021-05-04 DIAGNOSIS — S8412XA Injury of peroneal nerve at lower leg level, left leg, initial encounter: Secondary | ICD-10-CM

## 2021-05-04 DIAGNOSIS — G894 Chronic pain syndrome: Secondary | ICD-10-CM | POA: Diagnosis not present

## 2021-05-04 MED ORDER — OXYCODONE HCL 10 MG PO TABS: 10.0000 mg | ORAL_TABLET | Freq: Three times a day (TID) | ORAL | 0 refills | Status: DC | PRN

## 2021-05-04 NOTE — Progress Notes (Signed)
? ?Subjective:  ? ? Patient ID: Juan [name], male    DOB: 12-Mar-1965, 56 y.o.   MRN: [id number] ? ?HPI: Juan [name] is a 56 y.o. male who returns for follow up appointment for chronic pain and medication refill. He states his pain is located in his left lower extremity. He rates his pain 4. His current exercise regime is walking and performing stretching exercises. ? ?Mr. Curtice Morphine equivalent is 45.00 MME.   Last UDS was 03/06/2021, it was consistent.  ?  ?Pain Inventory ?Average Pain 8 ?Pain Right Now 4 ?My pain is burning and tingling ? ?In the last 24 hours, has pain interfered with the following? ?General activity 10 ?Relation with others 10 ?Enjoyment of life 10 ?What TIME of day is your pain at its worst? night ?Sleep (in general) Fair ? ?Pain is worse with: walking ?Pain improves with: medication ?Relief from Meds: 9 ? ?No family history on file. ?Social History  ? ?Socioeconomic History  ? Marital status: Divorced  ?  Spouse name: Not on file  ? Number of children: Not on file  ? Years of education: Not on file  ? Highest education level: Not on file  ?Occupational History  ? Not on file  ?Tobacco Use  ? Smoking status: Never  ? Smokeless tobacco: Never  ?Vaping Use  ? Vaping Use: Never used  ?Substance and Sexual Activity  ? Alcohol use: Not Currently  ? Drug use: Never  ? Sexual activity: Not on file  ?Other Topics Concern  ? Not on file  ?Social History Narrative  ? Not on file  ? ?Social Determinants of Health  ? ?Financial Resource Strain: Not on file  ?Food Insecurity: Not on file  ?Transportation Needs: Not on file  ?Physical Activity: Not on file  ?Stress: Not on file  ?Social Connections: Not on file  ? ?Past Surgical History:  ?Procedure Laterality Date  ? LEG SURGERY    ? ?Past Surgical History:  ?Procedure Laterality Date  ? LEG SURGERY    ? ?Past Medical History:  ?Diagnosis Date  ? GERD (gastroesophageal reflux disease)   ? ?BP 130/76   Pulse 68   Ht 6\' 1"  (1.854 m)   Wt 226 lb (102.5  kg)   SpO2 95%   BMI 29.82 kg/m?  ? ?Opioid Risk Score:   ?Fall Risk Score:  `1 ? ?Depression screen PHQ 2/9 ? ?Depression screen [facility] 2/9 05/04/2021 04/05/2021 03/06/2021 02/06/2021 01/08/2021 12/07/2020 10/10/2020  ?Decreased Interest 0 0 0 0 0 0 0  ?Down, Depressed, Hopeless 0 0 0 0 0 0 0  ?PHQ - 2 Score 0 0 0 0 0 0 0  ?Altered sleeping - - - - - - -  ?Tired, decreased energy - - - - - - -  ?Change in appetite - - - - - - -  ?Feeling bad or failure about yourself  - - - - - - -  ?Trouble concentrating - - - - - - -  ?Moving slowly or fidgety/restless - - - - - - -  ?Suicidal thoughts - - - - - - -  ?PHQ-9 Score - - - - - - -  ?Difficult doing work/chores - - - - - - -  ?  ? ? ?Review of Systems  ?Musculoskeletal:   ?     Whole left side pain  ?All other systems reviewed and are negative. ? ?   ?Objective:  ? Physical Exam ?Vitals and nursing note reviewed.  ?  Constitutional:   ?   Appearance: Normal appearance.  ?Cardiovascular:  ?   Rate and Rhythm: Normal rate and regular rhythm.  ?   Pulses: Normal pulses.  ?   Heart sounds: Normal heart sounds.  ?Pulmonary:  ?   Effort: Pulmonary effort is normal.  ?   Breath sounds: Normal breath sounds.  ?Musculoskeletal:  ?   Cervical back: Normal range of motion and neck supple.  ?   Comments: Normal Muscle Bulk and Muscle Testing Reveals:  ?Upper Extremities: Full ROM and Muscle Strength 5/5 ?Lower Extremities: Full ROM and Muscle Strength 5/5 ?Arises from Table slowly using cane for support ?Narrow Based  Gait  ?   ?Skin: ?   General: Skin is warm and dry.  ?Neurological:  ?   Mental Status: He is alert and oriented to person, place, and time.  ?Psychiatric:     ?   Mood and Affect: Mood normal.     ?   Behavior: Behavior normal.  ? ? ? ? ?   ?Assessment & Plan:  ?1. Chronic Left Common Peroneal Nerve Injury: He has Chronic left foot drop, gait abnormality and chronic neuropathic Pain: Lyrica ineffective he reports. Continue  To Monitor.  05/04/2021 ?Refilled:  Oxycodone 10  mg one tablet 3 times a day as needed for pain # 90.   ?We will continue the opioid monitoring program, this consists of regular clinic visits, examinations, urine drug screen, pill counts as well as use of [place] Controlled Substance Reporting system. A 12 month History has been reviewed on the [place] Controlled Substance Reporting System on 05/04/2021. ?2. Left Lumbar Radiculitis: No complaints today. Continue HEP as Tolerated. Continue current medication regimen. Continue to Monitor. 05/04/2021 ?  ?F/U in 1 month  ?  ? ?

## 2021-05-07 ENCOUNTER — Telehealth: Payer: Self-pay | Admitting: Registered Nurse

## 2021-05-07 ENCOUNTER — Telehealth: Payer: Self-pay | Admitting: *Deleted

## 2021-05-07 NOTE — Telephone Encounter (Signed)
Patient states he would like to speak to you.  ?

## 2021-05-07 NOTE — Telephone Encounter (Signed)
Return Mr. Navedo Call, all questions answered.  ?

## 2021-05-07 NOTE — Telephone Encounter (Signed)
Approval Timeframe: Start Date 05/04/2021 End Date 11/07/2021  ?

## 2021-05-07 NOTE — Telephone Encounter (Signed)
Prior auth initiated with [facility] [place] Medicaid via CoverMyMeds. ?

## 2021-06-04 ENCOUNTER — Encounter: Payer: Medicaid Other | Attending: Registered Nurse | Admitting: Registered Nurse

## 2021-06-04 ENCOUNTER — Encounter: Payer: Self-pay | Admitting: Registered Nurse

## 2021-06-04 VITALS — BP 139/83 | HR 61 | Ht 73.0 in | Wt 226.0 lb

## 2021-06-04 DIAGNOSIS — Z5181 Encounter for therapeutic drug level monitoring: Secondary | ICD-10-CM | POA: Diagnosis present

## 2021-06-04 DIAGNOSIS — S8412XA Injury of peroneal nerve at lower leg level, left leg, initial encounter: Secondary | ICD-10-CM | POA: Diagnosis present

## 2021-06-04 DIAGNOSIS — G894 Chronic pain syndrome: Secondary | ICD-10-CM | POA: Diagnosis present

## 2021-06-04 DIAGNOSIS — Z79899 Other long term (current) drug therapy: Secondary | ICD-10-CM | POA: Diagnosis present

## 2021-06-04 DIAGNOSIS — M79605 Pain in left leg: Secondary | ICD-10-CM | POA: Insufficient documentation

## 2021-06-04 MED ORDER — OXYCODONE HCL 10 MG PO TABS: 10.0000 mg | ORAL_TABLET | Freq: Three times a day (TID) | ORAL | 0 refills | Status: DC | PRN

## 2021-06-04 NOTE — Progress Notes (Signed)
? ?Subjective:  ? ? Patient ID: Juan [name], male    DOB: 08-14-1965, 56 y.o.   MRN: [id number] ? ?HPI: Juan [name] is a 56 y.o. male who returns for follow up appointment for chronic pain and medication refill. He states his  pain is located in his left lower extremity and left foot. He  rates his pain 9. His current exercise regime is walking and performing stretching exercises. ? ?Mr. Thebeau Morphine equivalent is 45.00 MME.   Last UDS was Performed on 03/06/2021, it was consistent.  ?  ?Pain Inventory ?Average Pain 9 ?Pain Right Now 9 ?My pain is sharp and stabbing ? ?In the last 24 hours, has pain interfered with the following? ?General activity 10 ?Relation with others 10 ?Enjoyment of life 10 ?What TIME of day is your pain at its worst? night ?Sleep (in general) Fair ? ?Pain is worse with: some activites ?Pain improves with: medication ?Relief from Meds: 6 ? ?No family history on file. ?Social History  ? ?Socioeconomic History  ? Marital status: Divorced  ?  Spouse name: Not on file  ? Number of children: Not on file  ? Years of education: Not on file  ? Highest education level: Not on file  ?Occupational History  ? Not on file  ?Tobacco Use  ? Smoking status: Never  ? Smokeless tobacco: Never  ?Vaping Use  ? Vaping Use: Never used  ?Substance and Sexual Activity  ? Alcohol use: Not Currently  ? Drug use: Never  ? Sexual activity: Not on file  ?Other Topics Concern  ? Not on file  ?Social History Narrative  ? Not on file  ? ?Social Determinants of Health  ? ?Financial Resource Strain: Not on file  ?Food Insecurity: Not on file  ?Transportation Needs: Not on file  ?Physical Activity: Not on file  ?Stress: Not on file  ?Social Connections: Not on file  ? ?Past Surgical History:  ?Procedure Laterality Date  ? LEG SURGERY    ? ?Past Surgical History:  ?Procedure Laterality Date  ? LEG SURGERY    ? ?Past Medical History:  ?Diagnosis Date  ? GERD (gastroesophageal reflux disease)   ? ?BP 139/83   Pulse 61   Ht 6'  1" (1.854 m)   Wt 226 lb (102.5 kg)   SpO2 95%   BMI 29.82 kg/m?  ? ?Opioid Risk Score:   ?Fall Risk Score:  `1 ? ?Depression screen PHQ 2/9 ? ? ?  05/04/2021  ?  9:14 AM 04/05/2021  ?  8:48 AM 03/06/2021  ?  9:08 AM 02/06/2021  ?  9:12 AM 01/08/2021  ?  9:35 AM 12/07/2020  ?  9:35 AM 10/10/2020  ? 12:00 PM  ?Depression screen PHQ 2/9  ?Decreased Interest 0 0 0 0 0 0 0  ?Down, Depressed, Hopeless 0 0 0 0 0 0 0  ?PHQ - 2 Score 0 0 0 0 0 0 0  ?  ? ?Review of Systems  ?Musculoskeletal:   ?     Whole left side of body pain  ?All other systems reviewed and are negative. ? ?   ?Objective:  ? Physical Exam ?Vitals and nursing note reviewed.  ?Constitutional:   ?   Appearance: Normal appearance.  ?Cardiovascular:  ?   Rate and Rhythm: Normal rate and regular rhythm.  ?   Pulses: Normal pulses.  ?   Heart sounds: Normal heart sounds.  ?Pulmonary:  ?   Effort: Pulmonary effort is normal.  ?  Breath sounds: Normal breath sounds.  ?Musculoskeletal:  ?   Cervical back: Normal range of motion and neck supple.  ?   Comments: Normal Muscle Bulk and Muscle Testing Reveals:  ?Upper Extremities: Full ROM and Muscle Strength 5/5 ? Lower Extremities: Full ROM and Muscle Strength 5/5 ?Left Lower Extremity Muscle Waisting Noted ?Arises from Chair with ease using cane for support ?Narrow Based  Gait  ?   ?Skin: ?   General: Skin is warm and dry.  ?Neurological:  ?   Mental Status: He is alert and oriented to person, place, and time.  ?Psychiatric:     ?   Mood and Affect: Mood normal.     ?   Behavior: Behavior normal.  ? ? ? ? ?   ?Assessment & Plan:  ?1. Chronic Left Common Peroneal Nerve Injury: He has Chronic left foot drop, gait abnormality and chronic neuropathic Pain: Lyrica ineffective he reports. Continue  To Monitor.  06/04/2021 ?Refilled:  Oxycodone 10 mg one tablet 3 times a day as needed for pain # 90.   ?We will continue the opioid monitoring program, this consists of regular clinic visits, examinations, urine drug screen,  pill counts as well as use of [place] Controlled Substance Reporting system. A 12 month History has been reviewed on the [place] Controlled Substance Reporting System on 06/04/2021. ?2. Left Lumbar Radiculitis: No complaints today. Continue HEP as Tolerated. Continue current medication regimen. Continue to Monitor. 06/04/2021 ?  ?F/U in 1 month  ? ?

## 2021-07-04 ENCOUNTER — Encounter: Payer: Self-pay | Admitting: Registered Nurse

## 2021-07-04 ENCOUNTER — Encounter: Payer: Medicaid Other | Attending: Registered Nurse | Admitting: Registered Nurse

## 2021-07-04 VITALS — BP 130/86 | HR 62 | Ht 73.0 in | Wt 225.0 lb

## 2021-07-04 DIAGNOSIS — Z79899 Other long term (current) drug therapy: Secondary | ICD-10-CM | POA: Diagnosis present

## 2021-07-04 DIAGNOSIS — M545 Low back pain, unspecified: Secondary | ICD-10-CM

## 2021-07-04 DIAGNOSIS — Z5181 Encounter for therapeutic drug level monitoring: Secondary | ICD-10-CM | POA: Diagnosis present

## 2021-07-04 DIAGNOSIS — M21372 Foot drop, left foot: Secondary | ICD-10-CM | POA: Insufficient documentation

## 2021-07-04 DIAGNOSIS — M79605 Pain in left leg: Secondary | ICD-10-CM

## 2021-07-04 DIAGNOSIS — S8412XA Injury of peroneal nerve at lower leg level, left leg, initial encounter: Secondary | ICD-10-CM | POA: Diagnosis not present

## 2021-07-04 DIAGNOSIS — R269 Unspecified abnormalities of gait and mobility: Secondary | ICD-10-CM | POA: Diagnosis not present

## 2021-07-04 DIAGNOSIS — G8929 Other chronic pain: Secondary | ICD-10-CM

## 2021-07-04 DIAGNOSIS — G894 Chronic pain syndrome: Secondary | ICD-10-CM

## 2021-07-04 DIAGNOSIS — M5416 Radiculopathy, lumbar region: Secondary | ICD-10-CM | POA: Insufficient documentation

## 2021-07-04 MED ORDER — OXYCODONE HCL 10 MG PO TABS: 10.0000 mg | ORAL_TABLET | Freq: Three times a day (TID) | ORAL | 0 refills | Status: DC | PRN

## 2021-07-04 NOTE — Progress Notes (Signed)
Subjective:    Patient ID: Juan [name], male    DOB: Jul 04, 1965, 56 y.o.   MRN: [id number]  HPI: Juan [name] is a 56 y.o. male who returns for follow up appointment for chronic pain and medication refill. He states his pain is located in his lower back and  left lower extremity. He rates his pain 9. His current exercise regime is walking and performing stretching exercises.  [name] Morphine equivalent is 45.00 MME.   UDS ordered today.      Pain Inventory Average Pain 8 Pain Right Now 9 My pain is constant, sharp, stabbing, and tingling  In the last 24 hours, has pain interfered with the following? General activity 10 Relation with others 10 Enjoyment of life 10 What TIME of day is your pain at its worst? night Sleep (in general) Good  Pain is worse with: some activites Pain improves with: medication Relief from Meds: 10      History reviewed. No pertinent family history. Social History   Socioeconomic History   Marital status: Divorced    Spouse name: Not on file   Number of children: Not on file   Years of education: Not on file   Highest education level: Not on file  Occupational History   Not on file  Tobacco Use   Smoking status: Never   Smokeless tobacco: Never  Vaping Use   Vaping Use: Never used  Substance and Sexual Activity   Alcohol use: Not Currently   Drug use: Never   Sexual activity: Not on file  Other Topics Concern   Not on file  Social History Narrative   Not on file   Social Determinants of Health   Financial Resource Strain: Not on file  Food Insecurity: Not on file  Transportation Needs: Not on file  Physical Activity: Not on file  Stress: Not on file  Social Connections: Not on file   Past Surgical History:  Procedure Laterality Date   LEG SURGERY     Past Medical History:  Diagnosis Date   GERD (gastroesophageal reflux disease)    BP 130/86   Pulse 62   Ht 6\' 1"  (1.854 m)   Wt 225 lb (102.1 kg)   SpO2 96%   BMI  29.69 kg/m   Opioid Risk Score:   Fall Risk Score:  `1  Depression screen [facility] 2/9     07/04/2021    9:19 AM 05/04/2021    9:14 AM 04/05/2021    8:48 AM 03/06/2021    9:08 AM 02/06/2021    9:12 AM 01/08/2021    9:35 AM 12/07/2020    9:35 AM  Depression screen PHQ 2/9  Decreased Interest 0 0 0 0 0 0 0  Down, Depressed, Hopeless 0 0 0 0 0 0 0  PHQ - 2 Score 0 0 0 0 0 0 0    Review of Systems  Musculoskeletal:        Left arm pain from shoulder down to hand, left leg pain from hip down to foot  All other systems reviewed and are negative.     Objective:   Physical Exam Vitals and nursing note reviewed.  Constitutional:      Appearance: Normal appearance.  Cardiovascular:     Rate and Rhythm: Normal rate and regular rhythm.     Pulses: Normal pulses.     Heart sounds: Normal heart sounds.  Pulmonary:     Effort: Pulmonary effort is normal.     Breath  sounds: Normal breath sounds.  Musculoskeletal:     Cervical back: Normal range of motion and neck supple.     Comments: Normal Muscle Bulk and Muscle Testing Reveals:  Upper Extremities:Full  ROM and Muscle Strength 5/5  Lower Extremities: Full ROM and Muscle Strength 5/5 Arises from chair slowly Narrow Based  Gait     Skin:    General: Skin is warm and dry.  Neurological:     Mental Status: He is alert and oriented to person, place, and time.  Psychiatric:        Mood and Affect: Mood normal.        Behavior: Behavior normal.         Assessment & Plan:  1. Chronic Left Common Peroneal Nerve Injury: He has Chronic left foot drop, gait abnormality and chronic neuropathic Pain: Lyrica ineffective he reports. Continue  To Monitor.  07/04/2021 Refilled:  Oxycodone 10 mg one tablet 3 times a day as needed for pain # 90.   We will continue the opioid monitoring program, this consists of regular clinic visits, examinations, urine drug screen, pill counts as well as use of [place] Controlled Substance Reporting  system. A 12 month History has been reviewed on the [place] Controlled Substance Reporting System on 07/04/2021. 2. Left Lumbar Radiculitis: No complaints today. Continue HEP as Tolerated. Continue current medication regimen. Continue to Monitor. 07/04/2021   F/U in 1 month

## 2021-07-10 LAB — TOXASSURE SELECT,+ANTIDEPR,UR

## 2021-07-11 ENCOUNTER — Telehealth: Payer: Self-pay | Admitting: *Deleted

## 2021-07-11 NOTE — Telephone Encounter (Signed)
Urine drug screen for this encounter is consistent for prescribed medication 

## 2021-08-01 ENCOUNTER — Encounter: Payer: Medicaid Other | Attending: Registered Nurse | Admitting: Registered Nurse

## 2021-08-01 ENCOUNTER — Encounter: Payer: Self-pay | Admitting: Registered Nurse

## 2021-08-01 VITALS — BP 136/84 | HR 66 | Ht 73.0 in | Wt 224.0 lb

## 2021-08-01 DIAGNOSIS — M545 Low back pain, unspecified: Secondary | ICD-10-CM

## 2021-08-01 DIAGNOSIS — Z79899 Other long term (current) drug therapy: Secondary | ICD-10-CM

## 2021-08-01 DIAGNOSIS — G894 Chronic pain syndrome: Secondary | ICD-10-CM | POA: Diagnosis not present

## 2021-08-01 DIAGNOSIS — G8929 Other chronic pain: Secondary | ICD-10-CM | POA: Diagnosis present

## 2021-08-01 DIAGNOSIS — Y939 Activity, unspecified: Secondary | ICD-10-CM | POA: Diagnosis not present

## 2021-08-01 DIAGNOSIS — Z5181 Encounter for therapeutic drug level monitoring: Secondary | ICD-10-CM | POA: Diagnosis not present

## 2021-08-01 DIAGNOSIS — X58XXXA Exposure to other specified factors, initial encounter: Secondary | ICD-10-CM | POA: Insufficient documentation

## 2021-08-01 DIAGNOSIS — S8412XA Injury of peroneal nerve at lower leg level, left leg, initial encounter: Secondary | ICD-10-CM

## 2021-08-01 DIAGNOSIS — M79605 Pain in left leg: Secondary | ICD-10-CM | POA: Diagnosis present

## 2021-08-01 MED ORDER — OXYCODONE HCL 10 MG PO TABS
10.0000 mg | ORAL_TABLET | Freq: Three times a day (TID) | ORAL | 0 refills | Status: DC | PRN
Start: 2021-08-01 — End: 2021-09-04

## 2021-08-01 NOTE — Progress Notes (Signed)
Subjective:    Patient ID: Juan [name], male    DOB: December 28, 1965, 56 y.o.   MRN: [id number]  HPI: Juan [name] is a 56 y.o. male who returns for follow up appointment for chronic pain and medication refill. He states his  pain is located in his lower back and left lower extremity. He rates his pain 8. His current exercise regime is walking and performing stretching exercises.  Juan [name] states he experienced vision changes in his left eye, opthalmology following. We will continue to monitor.   Juan [name] Morphine equivalent is 45.00  MME.   Last UDS was Performed on 07/04/2021, it was consistent.     Pain Inventory Average Pain 8 Pain Right Now 8 My pain is stabbing  In the last 24 hours, has pain interfered with the following? General activity 10 Relation with others 10 Enjoyment of life 10 What TIME of day is your pain at its worst? night Sleep (in general) Fair  Pain is worse with: inactivity Pain improves with: medication Relief from Meds: 10  History reviewed. No pertinent family history. Social History   Socioeconomic History   Marital status: Divorced    Spouse name: Not on file   Number of children: Not on file   Years of education: Not on file   Highest education level: Not on file  Occupational History   Not on file  Tobacco Use   Smoking status: Never   Smokeless tobacco: Never  Vaping Use   Vaping Use: Never used  Substance and Sexual Activity   Alcohol use: Not Currently   Drug use: Never   Sexual activity: Not on file  Other Topics Concern   Not on file  Social History Narrative   Not on file   Social Determinants of Health   Financial Resource Strain: Not on file  Food Insecurity: Not on file  Transportation Needs: Not on file  Physical Activity: Not on file  Stress: Not on file  Social Connections: Not on file   Past Surgical History:  Procedure Laterality Date   LEG SURGERY     Past Surgical History:  Procedure Laterality Date   LEG  SURGERY     Past Medical History:  Diagnosis Date   GERD (gastroesophageal reflux disease)    BP 136/84   Pulse 66   Ht 6\' 1"  (1.854 m)   Wt 224 lb (101.6 kg)   SpO2 96%   BMI 29.55 kg/m   Opioid Risk Score:   Fall Risk Score:  `1  Depression screen [facility] 2/9     08/01/2021    9:03 AM 07/04/2021    9:19 AM 05/04/2021    9:14 AM 04/05/2021    8:48 AM 03/06/2021    9:08 AM 02/06/2021    9:12 AM 01/08/2021    9:35 AM  Depression screen PHQ 2/9  Decreased Interest 0 0 0 0 0 0 0  Down, Depressed, Hopeless 0 0 0 0 0 0 0  PHQ - 2 Score 0 0 0 0 0 0 0     Review of Systems  Constitutional: Negative.   HENT: Negative.    Eyes: Negative.   Respiratory: Negative.    Cardiovascular: Negative.   Gastrointestinal: Negative.   Endocrine: Negative.   Genitourinary: Negative.   Musculoskeletal:  Positive for gait problem.  Skin: Negative.   Allergic/Immunologic: Negative.   Hematological: Negative.   Psychiatric/Behavioral: Negative.        Objective:   Physical Exam Vitals and  nursing note reviewed.  Constitutional:      Appearance: Normal appearance.  Cardiovascular:     Rate and Rhythm: Normal rate and regular rhythm.     Pulses: Normal pulses.     Heart sounds: Normal heart sounds.  Pulmonary:     Effort: Pulmonary effort is normal.     Breath sounds: Normal breath sounds.  Musculoskeletal:     Cervical back: Normal range of motion and neck supple.     Comments: Normal Muscle Bulk ( Right Lower Extremity) Muscle Waisting Left lower Extremity and Muscle Testing Reveals:  Upper Extremities: Full ROM and Muscle Strength 5/5  Lumbar Paraspinal Tenderness: L-4-L-5 Lower Extremities: Full ROM and Muscle Strength 5/5 Arises from Table with ease Narrow Based  Gait     Skin:    General: Skin is warm and dry.  Neurological:     Mental Status: He is alert and oriented to person, place, and time.  Psychiatric:        Mood and Affect: Mood normal.        Behavior: Behavior  normal.         Assessment & Plan:  1. Chronic Left Common Peroneal Nerve Injury: He has Chronic left foot drop, gait abnormality and chronic neuropathic Pain: Lyrica ineffective he reports. Continue  To Monitor.  08/01/2021 Refilled:  Oxycodone 10 mg one tablet 3 times a day as needed for pain # 90.   We will continue the opioid monitoring program, this consists of regular clinic visits, examinations, urine drug screen, pill counts as well as use of [place] Controlled Substance Reporting system. A 12 month History has been reviewed on the [facility] on 08/01/2021. 2. Left Lumbar Radiculitis: Continue HEP as Tolerated. Continue current medication regimen. Continue to Monitor. 08/01/2021   F/U in 1 month

## 2021-08-31 ENCOUNTER — Encounter: Payer: Self-pay | Admitting: Registered Nurse

## 2021-08-31 ENCOUNTER — Encounter: Payer: Medicaid Other | Attending: Registered Nurse | Admitting: Registered Nurse

## 2021-08-31 ENCOUNTER — Telehealth: Payer: Self-pay

## 2021-08-31 VITALS — BP 130/80 | Ht 73.0 in | Wt 224.0 lb

## 2021-08-31 DIAGNOSIS — S8412XA Injury of peroneal nerve at lower leg level, left leg, initial encounter: Secondary | ICD-10-CM | POA: Insufficient documentation

## 2021-08-31 DIAGNOSIS — M79605 Pain in left leg: Secondary | ICD-10-CM | POA: Insufficient documentation

## 2021-08-31 DIAGNOSIS — Z5181 Encounter for therapeutic drug level monitoring: Secondary | ICD-10-CM | POA: Insufficient documentation

## 2021-08-31 DIAGNOSIS — G894 Chronic pain syndrome: Secondary | ICD-10-CM | POA: Insufficient documentation

## 2021-08-31 DIAGNOSIS — M5416 Radiculopathy, lumbar region: Secondary | ICD-10-CM | POA: Diagnosis not present

## 2021-08-31 DIAGNOSIS — Z79899 Other long term (current) drug therapy: Secondary | ICD-10-CM | POA: Insufficient documentation

## 2021-08-31 NOTE — Telephone Encounter (Signed)
Approved.  

## 2021-08-31 NOTE — Telephone Encounter (Signed)
PA submitted for Oxycodone 

## 2021-08-31 NOTE — Progress Notes (Signed)
Subjective:    Patient ID: Juan [name], male    DOB: [birth year], 56 y.o.   MRN: [id number]  HPI: Juan [name] is a 56 y.o. male whose appointment was changed to My-Chart Visit Today. We have  discussed the limitations of evaluation and management by telemedicine and the availability of in person appointments. The patient expressed understanding and agreed to proceed He states his pain is located in his lower back radiating into his left lower extremity. He rates his pain 7. His current exercise regime is walking and performing stretching exercises.  Juan [name] Morphine equivalent is 45.00 MME.  Last UDS was Performed on 07/04/2021, it was consistent.    Pain Inventory Average Pain 9 Pain Right Now 7 My pain is intermittent, sharp, burning, tingling, and aching  In the last 24 hours, has pain interfered with the following? General activity 0 Relation with others 0 Enjoyment of life 0 What TIME of day is your pain at its worst? night Sleep (in general) Good  Pain is worse with: walking and some activites Pain improves with: rest and medication Relief from Meds: 10  No family history on file. Social History   Socioeconomic History   Marital status: Divorced    Spouse name: Not on file   Number of children: Not on file   Years of education: Not on file   Highest education level: Not on file  Occupational History   Not on file  Tobacco Use   Smoking status: Never   Smokeless tobacco: Never  Vaping Use   Vaping Use: Never used  Substance and Sexual Activity   Alcohol use: Not Currently   Drug use: Never   Sexual activity: Not on file  Other Topics Concern   Not on file  Social History Narrative   Not on file   Social Determinants of Health   Financial Resource Strain: Not on file  Food Insecurity: Not on file  Transportation Needs: Not on file  Physical Activity: Not on file  Stress: Not on file  Social Connections: Not on file   Past Surgical History:  Procedure  Laterality Date   LEG SURGERY     Past Surgical History:  Procedure Laterality Date   LEG SURGERY     Past Medical History:  Diagnosis Date   GERD (gastroesophageal reflux disease)    BP 130/80 Comment: pt reported, virtual visit  Ht 6\' 1"  (1.854 m) Comment: pt reported, virtual visit  Wt 224 lb (101.6 kg) Comment: pt reported, virtual visit  BMI 29.55 kg/m   Opioid Risk Score:   Fall Risk Score:  `1  Depression screen [facility] 2/9     08/01/2021    9:03 AM 07/04/2021    9:19 AM 05/04/2021    9:14 AM 04/05/2021    8:48 AM 03/06/2021    9:08 AM 02/06/2021    9:12 AM 01/08/2021    9:35 AM  Depression screen PHQ 2/9  Decreased Interest 0 0 0 0 0 0 0  Down, Depressed, Hopeless 0 0 0 0 0 0 0  PHQ - 2 Score 0 0 0 0 0 0 0      Review of Systems  Musculoskeletal:  Positive for back pain.       Leg pain  All other systems reviewed and are negative.     Objective:   Physical Exam Vitals and nursing note reviewed.  Musculoskeletal:     Comments: No Physical Assessment: My Chart Visit  Assessment & Plan:  1. Chronic Left Common Peroneal Nerve Injury: He has Chronic left foot drop, gait abnormality and chronic neuropathic Pain: Lyrica ineffective he reports. Continue  To Monitor.  08/31/2021 Refilled:  Oxycodone 10 mg one tablet 3 times a day as needed for pain # 90.   We will continue the opioid monitoring program, this consists of regular clinic visits, examinations, urine drug screen, pill counts as well as use of [place] Controlled Substance Reporting system. A 12 month History has been reviewed on the [place] Controlled Substance Reporting System on 08/31/2021. 2. Left Lumbar Radiculitis: Continue HEP as Tolerated. Continue current medication regimen. Continue to Monitor. 06714/2023   F/U in 1 month   My Chart Video Visit Juan [name] used the chart System on My Chart  No Audio or Video for this Visit Location of Patient in his Car Location of  Provider: Her Home

## 2021-09-04 ENCOUNTER — Telehealth: Payer: Self-pay | Admitting: Registered Nurse

## 2021-09-04 MED ORDER — OXYCODONE HCL 10 MG PO TABS: 10.0000 mg | ORAL_TABLET | Freq: Three times a day (TID) | ORAL | 0 refills | Status: DC | PRN

## 2021-09-04 NOTE — Telephone Encounter (Signed)
PMP was Reviewed.  Oxycodone e- scribed today.  Mr. Juan [name] is aware off the above via [facility] CMA

## 2021-09-21 ENCOUNTER — Telehealth: Payer: Self-pay | Admitting: *Deleted

## 2021-09-21 NOTE — Telephone Encounter (Signed)
Please do prior auth on oxycodone

## 2021-09-24 NOTE — Telephone Encounter (Signed)
Juan [name] requested we do a prior auth for his oxycodone. I attempted to submit but [facility] says prior Berkley Harvey is not required at this time.

## 2021-09-28 ENCOUNTER — Encounter: Payer: Medicaid Other | Admitting: Registered Nurse

## 2021-10-01 ENCOUNTER — Ambulatory Visit: Payer: Medicaid Other | Admitting: Registered Nurse

## 2021-10-05 ENCOUNTER — Encounter: Payer: Self-pay | Admitting: Registered Nurse

## 2021-10-05 ENCOUNTER — Encounter: Payer: Medicaid Other | Attending: Registered Nurse | Admitting: Registered Nurse

## 2021-10-05 VITALS — BP 119/77 | HR 54 | Ht 73.0 in | Wt 224.4 lb

## 2021-10-05 DIAGNOSIS — Z5181 Encounter for therapeutic drug level monitoring: Secondary | ICD-10-CM | POA: Diagnosis not present

## 2021-10-05 DIAGNOSIS — K219 Gastro-esophageal reflux disease without esophagitis: Secondary | ICD-10-CM | POA: Insufficient documentation

## 2021-10-05 DIAGNOSIS — G8929 Other chronic pain: Secondary | ICD-10-CM

## 2021-10-05 DIAGNOSIS — M25512 Pain in left shoulder: Secondary | ICD-10-CM | POA: Diagnosis not present

## 2021-10-05 DIAGNOSIS — M21372 Foot drop, left foot: Secondary | ICD-10-CM | POA: Diagnosis not present

## 2021-10-05 DIAGNOSIS — M79605 Pain in left leg: Secondary | ICD-10-CM | POA: Diagnosis present

## 2021-10-05 DIAGNOSIS — M5416 Radiculopathy, lumbar region: Secondary | ICD-10-CM | POA: Insufficient documentation

## 2021-10-05 DIAGNOSIS — M545 Low back pain, unspecified: Secondary | ICD-10-CM | POA: Diagnosis present

## 2021-10-05 DIAGNOSIS — G894 Chronic pain syndrome: Secondary | ICD-10-CM | POA: Diagnosis not present

## 2021-10-05 DIAGNOSIS — Z760 Encounter for issue of repeat prescription: Secondary | ICD-10-CM | POA: Diagnosis not present

## 2021-10-05 DIAGNOSIS — Z79899 Other long term (current) drug therapy: Secondary | ICD-10-CM | POA: Diagnosis not present

## 2021-10-05 DIAGNOSIS — S8412XA Injury of peroneal nerve at lower leg level, left leg, initial encounter: Secondary | ICD-10-CM

## 2021-10-05 MED ORDER — OXYCODONE HCL 10 MG PO TABS: 10.0000 mg | ORAL_TABLET | Freq: Three times a day (TID) | ORAL | 0 refills | Status: DC | PRN

## 2021-10-05 NOTE — Progress Notes (Signed)
Subjective:    Patient ID: Juan [name], male    DOB: 11/15/1965, 56 y.o.   MRN: [id number]  HPI: Juan [name] is a 56 y.o. male who returns for follow up appointment for chronic pain and medication refill. He states his pain is located in his left shoulder, lower back and left lower extremity. He rates his pain 9. His current exercise regime is walking and performing stretching exercises.  Mr. Osborn Morphine equivalent is 45.00 MME.   Last UDS was Performed on 07/04/2021, it was consistent.   Apical pulse 60.  Pain Inventory Average Pain 9 Pain Right Now 9 My pain is sharp and aching  In the last 24 hours, has pain interfered with the following? General activity 10 Relation with others 10 Enjoyment of life 10 What TIME of day is your pain at its worst? evening Sleep (in general) Fair  Pain is worse with: some activites Pain improves with: rest and medication Relief from Meds: 10  No family history on file. Social History   Socioeconomic History   Marital status: Divorced    Spouse name: Not on file   Number of children: Not on file   Years of education: Not on file   Highest education level: Not on file  Occupational History   Not on file  Tobacco Use   Smoking status: Never   Smokeless tobacco: Never  Vaping Use   Vaping Use: Never used  Substance and Sexual Activity   Alcohol use: Not Currently   Drug use: Never   Sexual activity: Not on file  Other Topics Concern   Not on file  Social History Narrative   Not on file   Social Determinants of Health   Financial Resource Strain: Not on file  Food Insecurity: Not on file  Transportation Needs: Not on file  Physical Activity: Not on file  Stress: Not on file  Social Connections: Not on file   Past Surgical History:  Procedure Laterality Date   LEG SURGERY     Past Surgical History:  Procedure Laterality Date   LEG SURGERY     Past Medical History:  Diagnosis Date   GERD (gastroesophageal reflux  disease)    BP 119/77   Pulse (!) 54   Ht 6\' 1"  (1.854 m)   Wt 224 lb 6.4 oz (101.8 kg)   SpO2 97%   BMI 29.61 kg/m   Opioid Risk Score:   Fall Risk Score:  `1  Depression screen [facility] 2/9     08/01/2021    9:03 AM 07/04/2021    9:19 AM 05/04/2021    9:14 AM 04/05/2021    8:48 AM 03/06/2021    9:08 AM 02/06/2021    9:12 AM 01/08/2021    9:35 AM  Depression screen PHQ 2/9  Decreased Interest 0 0 0 0 0 0 0  Down, Depressed, Hopeless 0 0 0 0 0 0 0  PHQ - 2 Score 0 0 0 0 0 0 0    Review of Systems  Musculoskeletal:        Left side of body pain  All other systems reviewed and are negative.     Objective:   Physical Exam Vitals and nursing note reviewed.  Constitutional:      Appearance: Normal appearance.  Cardiovascular:     Rate and Rhythm: Normal rate and regular rhythm.  Musculoskeletal:     Cervical back: Normal range of motion and neck supple.  Neurological:     Mental  Status: He is alert.           Assessment & Plan:  1. Chronic Left Common Peroneal Nerve Injury: He has Chronic left foot drop, gait abnormality and chronic neuropathic Pain: Lyrica ineffective he reports. Continue  To Monitor.  10/05/2021 Refilled:  Oxycodone 10 mg one tablet 3 times a day as needed for pain # 90.   We will continue the opioid monitoring program, this consists of regular clinic visits, examinations, urine drug screen, pill counts as well as use of [place] Controlled Substance Reporting system. A 12 month History has been reviewed on the [place] Controlled Substance Reporting System on 10/05/2021. 2. Left Lumbar Radiculitis: No complaints today. Continue HEP as Tolerated. Continue current medication regimen. Continue to Monitor. 0818/2023 3. Left Shoulder Pain: Continue HEP as Tolerated. Continue to Monitor.  4. Chronic Low Back Pain without sciatica: Continue HEP as Tolerated. Continue to monitor.   F/U in 1 month

## 2021-10-08 ENCOUNTER — Telehealth: Payer: Self-pay

## 2021-10-08 ENCOUNTER — Telehealth: Payer: Self-pay | Admitting: Registered Nurse

## 2021-10-08 MED ORDER — OXYCODONE HCL 10 MG PO TABS: 10.0000 mg | ORAL_TABLET | Freq: Three times a day (TID) | ORAL | 0 refills | Status: DC | PRN

## 2021-10-08 NOTE — Telephone Encounter (Signed)
PMP was Reviewed.  Oxycodone e-scribed today. My- Chart message sent to Juan [name] regarding the above.

## 2021-10-08 NOTE — Telephone Encounter (Signed)
Current Pharmacy does not have pain meds needs sent to different pharmacy

## 2021-10-30 ENCOUNTER — Encounter: Payer: Medicaid Other | Attending: Registered Nurse | Admitting: Registered Nurse

## 2021-10-30 ENCOUNTER — Encounter: Payer: Self-pay | Admitting: Registered Nurse

## 2021-10-30 ENCOUNTER — Encounter: Payer: Medicaid Other | Admitting: Registered Nurse

## 2021-10-30 VITALS — BP 130/83 | HR 70 | Ht 73.0 in | Wt 222.6 lb

## 2021-10-30 DIAGNOSIS — M79605 Pain in left leg: Secondary | ICD-10-CM | POA: Diagnosis present

## 2021-10-30 DIAGNOSIS — M545 Low back pain, unspecified: Secondary | ICD-10-CM | POA: Diagnosis present

## 2021-10-30 DIAGNOSIS — G894 Chronic pain syndrome: Secondary | ICD-10-CM | POA: Diagnosis not present

## 2021-10-30 DIAGNOSIS — G8929 Other chronic pain: Secondary | ICD-10-CM | POA: Insufficient documentation

## 2021-10-30 DIAGNOSIS — Z5181 Encounter for therapeutic drug level monitoring: Secondary | ICD-10-CM | POA: Diagnosis not present

## 2021-10-30 DIAGNOSIS — S8412XA Injury of peroneal nerve at lower leg level, left leg, initial encounter: Secondary | ICD-10-CM | POA: Insufficient documentation

## 2021-10-30 DIAGNOSIS — Z79899 Other long term (current) drug therapy: Secondary | ICD-10-CM | POA: Insufficient documentation

## 2021-10-30 MED ORDER — OXYCODONE HCL 10 MG PO TABS: 10.0000 mg | ORAL_TABLET | Freq: Three times a day (TID) | ORAL | 0 refills | Status: DC | PRN

## 2021-10-30 NOTE — Progress Notes (Signed)
Subjective:    Patient ID: Juan [name], male    DOB: 1965-08-01, 56 y.o.   MRN: [id number]  HPI: Juan [name] is a 56 y.o. male who returns for follow up appointment for chronic pain and medication refill. He states his pain is located in his lower back and left lower extremity. He  rates his pain 3. His current exercise regime is walking and performing stretching exercises.  Juan [name] Morphine equivalent is 45.00 MME.   Last UDS was Performed on 07/04/2021, it was consistent.     Pain Inventory Average Pain 9 Pain Right Now 3 My pain is burning and stabbing  In the last 24 hours, has pain interfered with the following? General activity 10 Relation with others 10 Enjoyment of life 10 What TIME of day is your pain at its worst? night Sleep (in general) Fair  Pain is worse with: inactivity Pain improves with: medication Relief from Meds: 10  No family history on file. Social History   Socioeconomic History   Marital status: Divorced    Spouse name: Not on file   Number of children: Not on file   Years of education: Not on file   Highest education level: Not on file  Occupational History   Not on file  Tobacco Use   Smoking status: Never   Smokeless tobacco: Never  Vaping Use   Vaping Use: Never used  Substance and Sexual Activity   Alcohol use: Not Currently   Drug use: Never   Sexual activity: Not on file  Other Topics Concern   Not on file  Social History Narrative   Not on file   Social Determinants of Health   Financial Resource Strain: Not on file  Food Insecurity: Not on file  Transportation Needs: Not on file  Physical Activity: Not on file  Stress: Not on file  Social Connections: Not on file   Past Surgical History:  Procedure Laterality Date   LEG SURGERY     Past Surgical History:  Procedure Laterality Date   LEG SURGERY     Past Medical History:  Diagnosis Date   GERD (gastroesophageal reflux disease)    BP 130/83   Pulse 70   Ht 6'  1" (1.854 m)   Wt 222 lb 9.6 oz (101 kg)   SpO2 97%   BMI 29.37 kg/m   Opioid Risk Score:   Fall Risk Score:  `1  Depression screen [facility] 2/9     10/30/2021    8:18 AM 08/01/2021    9:03 AM 07/04/2021    9:19 AM 05/04/2021    9:14 AM 04/05/2021    8:48 AM 03/06/2021    9:08 AM 02/06/2021    9:12 AM  Depression screen PHQ 2/9  Decreased Interest 0 0 0 0 0 0 0  Down, Depressed, Hopeless 0 0 0 0 0 0 0  PHQ - 2 Score 0 0 0 0 0 0 0     Review of Systems  Constitutional: Negative.   HENT: Negative.    Eyes: Negative.   Respiratory: Negative.    Cardiovascular: Negative.   Gastrointestinal: Negative.   Endocrine: Negative.   Genitourinary: Negative.   Musculoskeletal:  Positive for back pain.  Skin: Negative.   Allergic/Immunologic: Negative.   Neurological: Negative.   Hematological: Negative.   Psychiatric/Behavioral: Negative.    All other systems reviewed and are negative.      Objective:   Physical Exam Vitals and nursing note reviewed.  Constitutional:  Appearance: Normal appearance.  Cardiovascular:     Rate and Rhythm: Normal rate and regular rhythm.     Pulses: Normal pulses.     Heart sounds: Normal heart sounds.  Pulmonary:     Effort: Pulmonary effort is normal.     Breath sounds: Normal breath sounds.  Musculoskeletal:     Cervical back: Normal range of motion and neck supple.     Comments: Normal Muscle Bulk except Left lower Extremity with Muscle Waisting. and Muscle Testing Reveals:  Upper Extremities: Full ROM and Muscle Strength 5/5  Lumbar Paraspinal Tenderness: L-4-L-5 Lower Extremities: Full ROM and Muscle Strength 5/5 Arises from Chair with ease Narrow Based  Gait     Skin:    General: Skin is warm and dry.  Neurological:     Mental Status: He is alert and oriented to person, place, and time.  Psychiatric:        Mood and Affect: Mood normal.        Behavior: Behavior normal.         Assessment & Plan:  1. Chronic Left Common  Peroneal Nerve Injury: He has Chronic left foot drop, gait abnormality and chronic neuropathic Pain: Lyrica ineffective he reports. Continue  To Monitor.  10/29/2021 Refilled:  Oxycodone 10 mg one tablet 3 times a day as needed for pain # 90.   We will continue the opioid monitoring program, this consists of regular clinic visits, examinations, urine drug screen, pill counts as well as use of [place] Controlled Substance Reporting system. A 12 month History has been reviewed on the [place] Controlled Substance Reporting System on 10/29/2021. 2. Left Lumbar Radiculitis: No complaints today. Continue HEP as Tolerated. Continue current medication regimen. Continue to Monitor. 0911/2023 3. Left Shoulder Pain: No complaints today. Continue HEP as Tolerated. Continue to Monitor. 10/29/2021 4. Chronic Low Back Pain without sciatica: Continue HEP as Tolerated. Continue to monitor. 10/29/2021   F/U in 1 month

## 2021-11-29 ENCOUNTER — Encounter: Payer: Self-pay | Admitting: Registered Nurse

## 2021-11-29 ENCOUNTER — Encounter: Payer: Medicaid Other | Attending: Registered Nurse | Admitting: Registered Nurse

## 2021-11-29 VITALS — BP 145/88 | HR 57 | Ht 73.0 in | Wt 223.0 lb

## 2021-11-29 DIAGNOSIS — G894 Chronic pain syndrome: Secondary | ICD-10-CM | POA: Diagnosis present

## 2021-11-29 DIAGNOSIS — R001 Bradycardia, unspecified: Secondary | ICD-10-CM | POA: Diagnosis present

## 2021-11-29 DIAGNOSIS — S8412XA Injury of peroneal nerve at lower leg level, left leg, initial encounter: Secondary | ICD-10-CM | POA: Insufficient documentation

## 2021-11-29 DIAGNOSIS — M5416 Radiculopathy, lumbar region: Secondary | ICD-10-CM | POA: Diagnosis present

## 2021-11-29 DIAGNOSIS — M79605 Pain in left leg: Secondary | ICD-10-CM | POA: Diagnosis present

## 2021-11-29 DIAGNOSIS — Z79899 Other long term (current) drug therapy: Secondary | ICD-10-CM | POA: Insufficient documentation

## 2021-11-29 DIAGNOSIS — Z5181 Encounter for therapeutic drug level monitoring: Secondary | ICD-10-CM | POA: Diagnosis present

## 2021-11-29 MED ORDER — OXYCODONE HCL 10 MG PO TABS: 10.0000 mg | ORAL_TABLET | Freq: Three times a day (TID) | ORAL | 0 refills | Status: DC | PRN

## 2021-11-29 NOTE — Progress Notes (Signed)
Subjective:    Patient ID: Juan [name], male    DOB: 04-Aug-1965, 57 y.o.   MRN: [id number]  HPI: Juan [name] is a 56 y.o. male who returns for follow up appointment for chronic pain and medication refill. He states his pain is located in his lower back radiating into his left lower extremity. He rates his pain 8. His current exercise regime is walking and performing stretching exercises.  [name] Morphine equivalent is 45.00 MME.   UDS ordered today.   Pain Inventory Average Pain 9 Pain Right Now 8 My pain is constant, burning, and stabbing  In the last 24 hours, has pain interfered with the following? General activity 10 Relation with others 10 Enjoyment of life 10 What TIME of day is your pain at its worst? night Sleep (in general) Fair  Pain is worse with: inactivity Pain improves with: medication Relief from Meds: 10  No family history on file. Social History   Socioeconomic History   Marital status: Divorced    Spouse name: Not on file   Number of children: Not on file   Years of education: Not on file   Highest education level: Not on file  Occupational History   Not on file  Tobacco Use   Smoking status: Never   Smokeless tobacco: Never  Vaping Use   Vaping Use: Never used  Substance and Sexual Activity   Alcohol use: Not Currently   Drug use: Never   Sexual activity: Not on file  Other Topics Concern   Not on file  Social History Narrative   Not on file   Social Determinants of Health   Financial Resource Strain: Not on file  Food Insecurity: Not on file  Transportation Needs: Not on file  Physical Activity: Not on file  Stress: Not on file  Social Connections: Not on file   Past Surgical History:  Procedure Laterality Date   LEG SURGERY     Past Surgical History:  Procedure Laterality Date   LEG SURGERY     Past Medical History:  Diagnosis Date   GERD (gastroesophageal reflux disease)    There were no vitals taken for this  visit.  Opioid Risk Score:   Fall Risk Score:  `1  Depression screen [facility] 2/9     10/30/2021    8:18 AM 08/01/2021    9:03 AM 07/04/2021    9:19 AM 05/04/2021    9:14 AM 04/05/2021    8:48 AM 03/06/2021    9:08 AM 02/06/2021    9:12 AM  Depression screen PHQ 2/9  Decreased Interest 0 0 0 0 0 0 0  Down, Depressed, Hopeless 0 0 0 0 0 0 0  PHQ - 2 Score 0 0 0 0 0 0 0    Review of Systems  Musculoskeletal:  Positive for back pain and gait problem.       Pain in left shoulder, left leg  All other systems reviewed and are negative.      Objective:   Physical Exam Vitals and nursing note reviewed.  Constitutional:      Appearance: Normal appearance.  Cardiovascular:     Rate and Rhythm: Normal rate and regular rhythm.     Pulses: Normal pulses.     Heart sounds: Normal heart sounds.  Pulmonary:     Effort: Pulmonary effort is normal.     Breath sounds: Normal breath sounds.  Musculoskeletal:     Cervical back: Normal range of motion and neck  supple.     Comments: Normal Muscle Bulk and Muscle Testing Reveals:  Upper Extremities: Full ROM and Muscle Strength 5/5 Lumbar Paraspinal Tenderness: L-4-L-5 Lower Extremities : Full ROM  and  Muscle Strength 5/5 Arises from Table with Ease Narrow Based Gait     Skin:    General: Skin is warm and dry.  Neurological:     Mental Status: He is alert and oriented to person, place, and time.  Psychiatric:        Mood and Affect: Mood normal.        Behavior: Behavior normal.         Assessment & Plan:  1. Chronic Left Common Peroneal Nerve Injury: He has Chronic left foot drop, gait abnormality and chronic neuropathic Pain: Lyrica ineffective he reports. Continue  To Monitor.  11/29/2021 Refilled:  Oxycodone 10 mg one tablet 3 times a day as needed for pain # 90.   We will continue the opioid monitoring program, this consists of regular clinic visits, examinations, urine drug screen, pill counts as well as use of [place]  Controlled Substance Reporting system. A 12 month History has been reviewed on the [place] on 11/29/2021. 2. Left Lumbar Radiculitis: Continue HEP as Tolerated. Continue current medication regimen. Continue to Monitor. 11/29/2021 3. Left Shoulder Pain: No complaints today. Continue HEP as Tolerated. Continue to Monitor. 11/29/2021 4. Chronic Low Back Pain without sciatica: Continue HEP as Tolerated. Continue to monitor. 11/29/2021   F/U in 1 month

## 2021-12-03 LAB — TOXASSURE SELECT,+ANTIDEPR,UR

## 2021-12-10 ENCOUNTER — Telehealth: Payer: Self-pay | Admitting: *Deleted

## 2021-12-10 NOTE — Telephone Encounter (Signed)
Urine drug screen for this encounter is consistent for prescribed medication 

## 2021-12-31 ENCOUNTER — Encounter: Payer: Self-pay | Admitting: Registered Nurse

## 2021-12-31 ENCOUNTER — Encounter: Payer: Medicaid Other | Attending: Registered Nurse | Admitting: Registered Nurse

## 2021-12-31 VITALS — BP 130/85 | Ht 72.0 in | Wt 222.0 lb

## 2021-12-31 DIAGNOSIS — Z5181 Encounter for therapeutic drug level monitoring: Secondary | ICD-10-CM | POA: Diagnosis not present

## 2021-12-31 DIAGNOSIS — G894 Chronic pain syndrome: Secondary | ICD-10-CM | POA: Insufficient documentation

## 2021-12-31 DIAGNOSIS — M79605 Pain in left leg: Secondary | ICD-10-CM | POA: Insufficient documentation

## 2021-12-31 DIAGNOSIS — Z79899 Other long term (current) drug therapy: Secondary | ICD-10-CM | POA: Insufficient documentation

## 2021-12-31 DIAGNOSIS — S8412XA Injury of peroneal nerve at lower leg level, left leg, initial encounter: Secondary | ICD-10-CM | POA: Diagnosis not present

## 2021-12-31 DIAGNOSIS — M5416 Radiculopathy, lumbar region: Secondary | ICD-10-CM | POA: Diagnosis not present

## 2021-12-31 MED ORDER — OXYCODONE HCL 10 MG PO TABS: 10.0000 mg | ORAL_TABLET | Freq: Three times a day (TID) | ORAL | 0 refills | Status: DC | PRN

## 2021-12-31 NOTE — Progress Notes (Signed)
Subjective:    Patient ID: Juan [name], male    DOB: August 19, 1965, 56 y.o.   MRN: [id number]  HPI: Juan [name] is a 56 y.o. male who called office reporting he wasn't feeling well this morning, he asked for My- Chart visit. His appointment was changed to My- Chart visit, he wasn't able to access video or audio, his visit was changed to telephone visit. We have  discussed the limitations of evaluation and management by telemedicine and the availability of in person appointments. The patient expressed understanding and agreed to proceed.  He states his pain is located in his lower back radiating into his left lower extremity and left lower extremity pain.He rates his pain 8. His current exercise regime is walking and performing stretching exercises.  Juan [name] Morphine equivalent is 45.00 MME.   Last UDS was Performed on 11/29/2021, it was consistent.    Pain Inventory Average Pain 6 Pain Right Now 8 My pain is intermittent, sharp, burning, dull, stabbing, tingling, and aching  In the last 24 hours, has pain interfered with the following? General activity 5 Relation with others 0 Enjoyment of life 0 What TIME of day is your pain at its worst? night Sleep (in general) Good  Pain is worse with: walking Pain improves with: rest and medication Relief from Meds: 10  No family history on file. Social History   Socioeconomic History   Marital status: Divorced    Spouse name: Not on file   Number of children: Not on file   Years of education: Not on file   Highest education level: Not on file  Occupational History   Not on file  Tobacco Use   Smoking status: Never   Smokeless tobacco: Never  Vaping Use   Vaping Use: Never used  Substance and Sexual Activity   Alcohol use: Not Currently   Drug use: Never   Sexual activity: Not on file  Other Topics Concern   Not on file  Social History Narrative   Not on file   Social Determinants of Health   Financial Resource Strain: Not on  file  Food Insecurity: Not on file  Transportation Needs: Not on file  Physical Activity: Not on file  Stress: Not on file  Social Connections: Not on file   Past Surgical History:  Procedure Laterality Date   LEG SURGERY     Past Surgical History:  Procedure Laterality Date   LEG SURGERY     Past Medical History:  Diagnosis Date   GERD (gastroesophageal reflux disease)    There were no vitals taken for this visit.  Opioid Risk Score:   Fall Risk Score:  `1  Depression screen [facility] 2/9     11/29/2021    9:05 AM 10/30/2021    8:18 AM 08/01/2021    9:03 AM 07/04/2021    9:19 AM 05/04/2021    9:14 AM 04/05/2021    8:48 AM 03/06/2021    9:08 AM  Depression screen PHQ 2/9  Decreased Interest 0 0 0 0 0 0 0  Down, Depressed, Hopeless 0 0 0 0 0 0 0  PHQ - 2 Score 0 0 0 0 0 0 0    Review of Systems  Musculoskeletal:  Positive for back pain.       Left leg pain      Objective:   Physical Exam Vitals and nursing note reviewed.  Musculoskeletal:     Comments: No Physical Exam Performed: Telephone visit  Assessment & Plan:  1. Chronic Left Common Peroneal Nerve Injury: He has Chronic left foot drop, gait abnormality and chronic neuropathic Pain: Lyrica ineffective he reports. Continue  To Monitor.  12/31/2021 Refilled:  Oxycodone 10 mg one tablet 3 times a day as needed for pain # 90.   We will continue the opioid monitoring program, this consists of regular clinic visits, examinations, urine drug screen, pill counts as well as use of [place] Controlled Substance Reporting system. A 12 month History has been reviewed on the [place] Controlled Substance Reporting System on 12/31/2021. 2. Left Lumbar Radiculitis: Continue HEP as Tolerated. Continue current medication regimen. Continue to Monitor. 12/31/2021 3. Left Shoulder Pain: No complaints today. Continue HEP as Tolerated. Continue to Monitor. 12/31/2021 4. Chronic Low Back Pain without sciatica:  Continue HEP as Tolerated. Continue to monitor. 12/31/2021   F/U in 1 month  Telephone Call  Establish Patient  Location of Patient : In his Home Location of Provider: In the Office

## 2022-01-30 ENCOUNTER — Encounter: Payer: Self-pay | Admitting: Registered Nurse

## 2022-01-30 ENCOUNTER — Encounter: Payer: Medicaid Other | Attending: Registered Nurse | Admitting: Registered Nurse

## 2022-01-30 VITALS — BP 123/83 | HR 67 | Ht 72.0 in | Wt 225.0 lb

## 2022-01-30 DIAGNOSIS — M5416 Radiculopathy, lumbar region: Secondary | ICD-10-CM | POA: Insufficient documentation

## 2022-01-30 DIAGNOSIS — Z5181 Encounter for therapeutic drug level monitoring: Secondary | ICD-10-CM | POA: Insufficient documentation

## 2022-01-30 DIAGNOSIS — G894 Chronic pain syndrome: Secondary | ICD-10-CM | POA: Insufficient documentation

## 2022-01-30 DIAGNOSIS — S8412XA Injury of peroneal nerve at lower leg level, left leg, initial encounter: Secondary | ICD-10-CM | POA: Insufficient documentation

## 2022-01-30 DIAGNOSIS — M79605 Pain in left leg: Secondary | ICD-10-CM | POA: Diagnosis present

## 2022-01-30 DIAGNOSIS — Z79899 Other long term (current) drug therapy: Secondary | ICD-10-CM | POA: Diagnosis present

## 2022-01-30 MED ORDER — OXYCODONE HCL 10 MG PO TABS: 10.0000 mg | ORAL_TABLET | Freq: Three times a day (TID) | ORAL | 0 refills | Status: DC | PRN

## 2022-01-30 NOTE — Progress Notes (Signed)
Subjective:    Patient ID: Juan [name], male    DOB: June 01, 1965, 56 y.o.   MRN: [id number]  HPI: Juan [name] is a 56 y.o. male who returns for follow up appointment for chronic pain and medication refill. He states his pain is located in his lower back radiating into his left lower extremity . He rates his pain 5. His current exercise regime is walking and performing stretching exercises.  Ms. Grundman Morphine equivalent is 45.00 MME.   Last UDS was Performed on 11/29/2021, it was consistent.    Pain Inventory Average Pain 8 Pain Right Now 5 My pain is constant and sharp  In the last 24 hours, has pain interfered with the following? General activity 10 Relation with others 10 Enjoyment of life 10 What TIME of day is your pain at its worst? night Sleep (in general) Good  Pain is worse with: walking Pain improves with: medication Relief from Meds: 9  No family history on file. Social History   Socioeconomic History   Marital status: Divorced    Spouse name: Not on file   Number of children: Not on file   Years of education: Not on file   Highest education level: Not on file  Occupational History   Not on file  Tobacco Use   Smoking status: Never   Smokeless tobacco: Never  Vaping Use   Vaping Use: Never used  Substance and Sexual Activity   Alcohol use: Not Currently   Drug use: Never   Sexual activity: Not on file  Other Topics Concern   Not on file  Social History Narrative   Not on file   Social Determinants of Health   Financial Resource Strain: Not on file  Food Insecurity: Not on file  Transportation Needs: Not on file  Physical Activity: Not on file  Stress: Not on file  Social Connections: Not on file   Past Surgical History:  Procedure Laterality Date   LEG SURGERY     Past Surgical History:  Procedure Laterality Date   LEG SURGERY     Past Medical History:  Diagnosis Date   GERD (gastroesophageal reflux disease)    Pulse 67   Ht 6' (1.829  m)   Wt 225 lb (102.1 kg)   SpO2 96%   BMI 30.52 kg/m   Opioid Risk Score:   Fall Risk Score:  `1  Depression screen [facility] 2/9     12/31/2021    8:56 AM 11/29/2021    9:05 AM 10/30/2021    8:18 AM 08/01/2021    9:03 AM 07/04/2021    9:19 AM 05/04/2021    9:14 AM 04/05/2021    8:48 AM  Depression screen PHQ 2/9  Decreased Interest 0 0 0 0 0 0 0  Down, Depressed, Hopeless 0 0 0 0 0 0 0  PHQ - 2 Score 0 0 0 0 0 0 0    Review of Systems  Musculoskeletal:        Left leg, left arm pain  All other systems reviewed and are negative.      Objective:   Physical Exam Vitals and nursing note reviewed.  Constitutional:      Appearance: Normal appearance.  Cardiovascular:     Rate and Rhythm: Normal rate and regular rhythm.     Pulses: Normal pulses.     Heart sounds: Normal heart sounds.  Pulmonary:     Effort: Pulmonary effort is normal.     Breath sounds: Normal  breath sounds.  Musculoskeletal:     Cervical back: Normal range of motion and neck supple.     Comments: Normal Muscle Bulk and Muscle Testing Reveals:  Upper Extremities: Full ROM and Muscle Strength 5/5  Lumbar Paraspinal Tenderness: L-4-L-5 Lower Extremities: Full ROM and Muscle Strength 5/5 Arises from Table with ease Narrow Based  Gait     Skin:    General: Skin is warm and dry.  Neurological:     Mental Status: He is alert and oriented to person, place, and time.  Psychiatric:        Mood and Affect: Mood normal.        Behavior: Behavior normal.         Assessment & Plan:  1. Chronic Left Common Peroneal Nerve Injury: He has Chronic left foot drop, gait abnormality and chronic neuropathic Pain: Lyrica ineffective he reports. Continue  To Monitor.  01/30/2022 Refilled:  Oxycodone 10 mg one tablet 3 times a day as needed for pain # 90.   We will continue the opioid monitoring program, this consists of regular clinic visits, examinations, urine drug screen, pill counts as well as use of [place]  Controlled Substance Reporting system. A 12 month History has been reviewed on the [place] on 01/30/2022. 2. Left Lumbar Radiculitis: Continue HEP as Tolerated. Continue current medication regimen. Continue to Monitor. 01/30/2022 3. Left Shoulder Pain: No complaints today. Continue HEP as Tolerated. Continue to Monitor. 01/30/2022 4. Chronic Low Back Pain without sciatica: Continue HEP as Tolerated. Continue to monitor. 01/30/2022  F/U in 1 month

## 2022-02-28 ENCOUNTER — Encounter: Payer: Self-pay | Admitting: Registered Nurse

## 2022-02-28 ENCOUNTER — Encounter: Payer: Medicaid Other | Attending: Registered Nurse | Admitting: Registered Nurse

## 2022-02-28 ENCOUNTER — Encounter: Payer: Medicaid Other | Admitting: Registered Nurse

## 2022-02-28 VITALS — BP 132/88 | HR 69 | Ht 72.0 in | Wt 225.0 lb

## 2022-02-28 DIAGNOSIS — Z5181 Encounter for therapeutic drug level monitoring: Secondary | ICD-10-CM | POA: Diagnosis present

## 2022-02-28 DIAGNOSIS — S8412XA Injury of peroneal nerve at lower leg level, left leg, initial encounter: Secondary | ICD-10-CM | POA: Diagnosis not present

## 2022-02-28 DIAGNOSIS — M79605 Pain in left leg: Secondary | ICD-10-CM | POA: Insufficient documentation

## 2022-02-28 DIAGNOSIS — Z79899 Other long term (current) drug therapy: Secondary | ICD-10-CM | POA: Diagnosis present

## 2022-02-28 DIAGNOSIS — M5416 Radiculopathy, lumbar region: Secondary | ICD-10-CM | POA: Diagnosis not present

## 2022-02-28 DIAGNOSIS — G894 Chronic pain syndrome: Secondary | ICD-10-CM | POA: Insufficient documentation

## 2022-02-28 MED ORDER — OXYCODONE HCL 10 MG PO TABS: 10.0000 mg | ORAL_TABLET | Freq: Three times a day (TID) | ORAL | 0 refills | Status: DC | PRN

## 2022-02-28 NOTE — Progress Notes (Signed)
Subjective:    Patient ID: Juan [name], male    DOB: May 20, 1965, 57 y.o.   MRN: [id number]  HPI: Juan [name] is a 57 y.o. male who returns for follow up appointment for chronic pain and medication refill. He states his pain is located in his lower back radiating into his left lower extremity and left foot pain. He rates his pain 8. His current exercise regime is walking and performing stretching exercises.  Juan [name] Morphine equivalent is 45.00 MME.   Last UDS was Performed on 11/29/2021, it was consistent.    Pain Inventory Average Pain 8 Pain Right Now 8 My pain is constant, sharp, and stabbing  In the last 24 hours, has pain interfered with the following? General activity 10 Relation with others 10 Enjoyment of life 10 What TIME of day is your pain at its worst? night Sleep (in general) Good  Pain is worse with: inactivity Pain improves with: medication Relief from Meds: 10  History reviewed. No pertinent family history. Social History   Socioeconomic History   Marital status: Divorced    Spouse name: Not on file   Number of children: Not on file   Years of education: Not on file   Highest education level: Not on file  Occupational History   Not on file  Tobacco Use   Smoking status: Never   Smokeless tobacco: Never  Vaping Use   Vaping Use: Never used  Substance and Sexual Activity   Alcohol use: Not Currently   Drug use: Never   Sexual activity: Not on file  Other Topics Concern   Not on file  Social History Narrative   Not on file   Social Determinants of Health   Financial Resource Strain: Not on file  Food Insecurity: Not on file  Transportation Needs: Not on file  Physical Activity: Not on file  Stress: Not on file  Social Connections: Not on file   Past Surgical History:  Procedure Laterality Date   LEG SURGERY     Past Surgical History:  Procedure Laterality Date   LEG SURGERY     Past Medical History:  Diagnosis Date   GERD  (gastroesophageal reflux disease)    BP 132/88   Pulse 69   Ht 6' (1.829 m)   Wt 225 lb (102.1 kg)   SpO2 96%   BMI 30.52 kg/m   Opioid Risk Score:   Fall Risk Score:  `1  Depression screen [facility] 2/9     01/30/2022    9:05 AM 12/31/2021    8:56 AM 11/29/2021    9:05 AM 10/30/2021    8:18 AM 08/01/2021    9:03 AM 07/04/2021    9:19 AM 05/04/2021    9:14 AM  Depression screen PHQ 2/9  Decreased Interest 0 0 0 0 0 0 0  Down, Depressed, Hopeless 0 0 0 0 0 0 0  PHQ - 2 Score 0 0 0 0 0 0 0      Review of Systems  Musculoskeletal:        Pain in on left side of the body shoulder, left arm,left leg  All other systems reviewed and are negative.      Objective:   Physical Exam Vitals and nursing note reviewed.  Constitutional:      Appearance: Normal appearance.  Cardiovascular:     Rate and Rhythm: Normal rate and regular rhythm.     Pulses: Normal pulses.     Heart sounds: Normal heart sounds.  Pulmonary:     Effort: Pulmonary effort is normal.     Breath sounds: Normal breath sounds.  Musculoskeletal:     Cervical back: Normal range of motion and neck supple.     Comments: Normal Muscle Bulk and Muscle Testing Reveals:  Upper Extremities: Full ROM and Muscle Strength 5/5 Lumbar Paraspinal Tenderness: L-4-L-5 Lower Extremities: Full ROM and Muscle Strength 5/5 Arises from Table with ease Narrow Based  Gait     Skin:    General: Skin is warm and dry.  Neurological:     Mental Status: He is alert and oriented to person, place, and time.  Psychiatric:        Mood and Affect: Mood normal.        Behavior: Behavior normal.         Assessment & Plan:  1. Chronic Left Common Peroneal Nerve Injury: He has Chronic left foot drop, gait abnormality and chronic neuropathic Pain: Lyrica ineffective he reports. Continue  To Monitor.  02/28/2022 Refilled:  Oxycodone 10 mg one tablet 3 times a day as needed for pain # 90.   We will continue the opioid monitoring program,  this consists of regular clinic visits, examinations, urine drug screen, pill counts as well as use of [place] Controlled Substance Reporting system. A 12 month History has been reviewed on the [place] on 02/28/2022. 2. Left Lumbar Radiculitis: Continue HEP as Tolerated. Continue current medication regimen. Continue to Monitor. 02/28/2022 3. Left Shoulder Pain: No complaints today. Continue HEP as Tolerated. Continue to Monitor. 02/28/2022  F/U in 1 month

## 2022-04-01 ENCOUNTER — Encounter: Payer: Self-pay | Admitting: Registered Nurse

## 2022-04-01 ENCOUNTER — Encounter: Payer: Medicaid Other | Attending: Registered Nurse | Admitting: Registered Nurse

## 2022-04-01 VITALS — BP 143/92 | HR 69 | Ht 72.0 in | Wt 220.0 lb

## 2022-04-01 DIAGNOSIS — M79605 Pain in left leg: Secondary | ICD-10-CM | POA: Diagnosis present

## 2022-04-01 DIAGNOSIS — Z5181 Encounter for therapeutic drug level monitoring: Secondary | ICD-10-CM | POA: Insufficient documentation

## 2022-04-01 DIAGNOSIS — Z79899 Other long term (current) drug therapy: Secondary | ICD-10-CM | POA: Insufficient documentation

## 2022-04-01 DIAGNOSIS — M5416 Radiculopathy, lumbar region: Secondary | ICD-10-CM | POA: Diagnosis present

## 2022-04-01 DIAGNOSIS — S8412XA Injury of peroneal nerve at lower leg level, left leg, initial encounter: Secondary | ICD-10-CM | POA: Diagnosis present

## 2022-04-01 DIAGNOSIS — G894 Chronic pain syndrome: Secondary | ICD-10-CM | POA: Diagnosis present

## 2022-04-01 MED ORDER — OXYCODONE HCL 10 MG PO TABS: 10.0000 mg | ORAL_TABLET | Freq: Three times a day (TID) | ORAL | 0 refills | Status: DC | PRN

## 2022-04-01 NOTE — Progress Notes (Unsigned)
Subjective:    Patient ID: Juan [name], male    DOB: 27-Jul-1965, 57 y.o.   MRN: [id number]  HPI: Juan [name] is a 57 y.o. male who returns for follow up appointment for chronic pain and medication refill. states *** pain is located in  ***. rates pain ***. current exercise regime is walking and performing stretching exercises.  [name] Morphine equivalent is *** MME.   Last UDS was Performed on 11/29/2021, it was consistent.    Pain Inventory Average Pain 9 Pain Right Now 6 My pain is sharp and stabbing  In the last 24 hours, has pain interfered with the following? General activity 10 Relation with others 10 Enjoyment of life 10 What TIME of day is your pain at its worst? varies Sleep (in general) Fair  Pain is worse with: inactivity Pain improves with: rest and medication Relief from Meds: 10  History reviewed. No pertinent family history. Social History   Socioeconomic History   Marital status: Divorced    Spouse name: Not on file   Number of children: Not on file   Years of education: Not on file   Highest education level: Not on file  Occupational History   Not on file  Tobacco Use   Smoking status: Never   Smokeless tobacco: Never  Vaping Use   Vaping Use: Never used  Substance and Sexual Activity   Alcohol use: Not Currently   Drug use: Never   Sexual activity: Not on file  Other Topics Concern   Not on file  Social History Narrative   Not on file   Social Determinants of Health   Financial Resource Strain: Not on file  Food Insecurity: Not on file  Transportation Needs: Not on file  Physical Activity: Not on file  Stress: Not on file  Social Connections: Not on file   Past Surgical History:  Procedure Laterality Date   LEG SURGERY     Past Surgical History:  Procedure Laterality Date   LEG SURGERY     Past Medical History:  Diagnosis Date   GERD (gastroesophageal reflux disease)    BP (!) 143/92   Pulse 69   Ht 6' (1.829 m)   Wt 220  lb (99.8 kg)   SpO2 96%   BMI 29.84 kg/m   Opioid Risk Score:   Fall Risk Score:  `1  Depression screen [facility] 2/9     04/01/2022    8:46 AM 02/28/2022    1:22 PM 01/30/2022    9:05 AM 12/31/2021    8:56 AM 11/29/2021    9:05 AM 10/30/2021    8:18 AM 08/01/2021    9:03 AM  Depression screen PHQ 2/9  Decreased Interest 0 0 0 0 0 0 0  Down, Depressed, Hopeless 0 0 0 0 0 0 0  PHQ - 2 Score 0 0 0 0 0 0 0      Review of Systems  Musculoskeletal:  Positive for back pain.       Left arm, shoulder, and leg pain   All other systems reviewed and are negative.     Objective:   Physical Exam        Assessment & Plan:  1. Chronic Left Common Peroneal Nerve Injury: He has Chronic left foot drop, gait abnormality and chronic neuropathic Pain: Lyrica ineffective he reports. Continue  To Monitor.  02/28/2022 Refilled:  Oxycodone 10 mg one tablet 3 times a day as needed for pain # 90.   We  will continue the opioid monitoring program, this consists of regular clinic visits, examinations, urine drug screen, pill counts as well as use of [place] Controlled Substance Reporting system. A 12 month History has been reviewed on the [place] on 02/28/2022. 2. Left Lumbar Radiculitis: Continue HEP as Tolerated. Continue current medication regimen. Continue to Monitor. 02/28/2022 3. Left Shoulder Pain: No complaints today. Continue HEP as Tolerated. Continue to Monitor. 02/28/2022   F/U in 1 month

## 2022-04-02 ENCOUNTER — Encounter: Payer: Self-pay | Admitting: Registered Nurse

## 2022-04-29 ENCOUNTER — Encounter: Payer: Medicaid Other | Attending: Registered Nurse | Admitting: Registered Nurse

## 2022-04-29 ENCOUNTER — Encounter: Payer: Self-pay | Admitting: Registered Nurse

## 2022-04-29 VITALS — BP 146/85 | HR 68 | Ht 72.0 in | Wt 221.4 lb

## 2022-04-29 DIAGNOSIS — M79605 Pain in left leg: Secondary | ICD-10-CM | POA: Diagnosis present

## 2022-04-29 DIAGNOSIS — Z5181 Encounter for therapeutic drug level monitoring: Secondary | ICD-10-CM | POA: Insufficient documentation

## 2022-04-29 DIAGNOSIS — S8412XA Injury of peroneal nerve at lower leg level, left leg, initial encounter: Secondary | ICD-10-CM | POA: Insufficient documentation

## 2022-04-29 DIAGNOSIS — Z79899 Other long term (current) drug therapy: Secondary | ICD-10-CM | POA: Insufficient documentation

## 2022-04-29 DIAGNOSIS — G894 Chronic pain syndrome: Secondary | ICD-10-CM | POA: Diagnosis not present

## 2022-04-29 MED ORDER — OXYCODONE HCL 10 MG PO TABS: 10.0000 mg | ORAL_TABLET | Freq: Three times a day (TID) | ORAL | 0 refills | Status: DC | PRN

## 2022-04-29 NOTE — Progress Notes (Signed)
Subjective:    Patient ID: Juan [name], male    DOB: 03/21/1965, 57 y.o.   MRN: [id number]  HPI: Juan [name] is a 57 y.o. male who returns for follow up appointment for chronic pain and medication refill. He states his pain is located in his left lower extremity. He rates his pain 8. His current exercise regime is walking, going to [employer] three days a week and performing stretching exercises.  Ms. Bertsch Morphine equivalent is 45.00 MME.   UDS ordered today.     Pain Inventory Average Pain 7 Pain Right Now 8 My pain is sharp and aching  In the last 24 hours, has pain interfered with the following? General activity 10 Relation with others 10 Enjoyment of life 10 What TIME of day is your pain at its worst? night Sleep (in general) Fair  Pain is worse with: inactivity Pain improves with: medication Relief from Meds: 10  No family history on file. Social History   Socioeconomic History   Marital status: Divorced    Spouse name: Not on file   Number of children: Not on file   Years of education: Not on file   Highest education level: Not on file  Occupational History   Not on file  Tobacco Use   Smoking status: Never   Smokeless tobacco: Never  Vaping Use   Vaping Use: Never used  Substance and Sexual Activity   Alcohol use: Not Currently   Drug use: Never   Sexual activity: Not on file  Other Topics Concern   Not on file  Social History Narrative   Not on file   Social Determinants of Health   Financial Resource Strain: Not on file  Food Insecurity: Not on file  Transportation Needs: Not on file  Physical Activity: Not on file  Stress: Not on file  Social Connections: Not on file   Past Surgical History:  Procedure Laterality Date   LEG SURGERY     Past Surgical History:  Procedure Laterality Date   LEG SURGERY     Past Medical History:  Diagnosis Date   GERD (gastroesophageal reflux disease)    BP (!) 140/80   Pulse 71   Ht 6' (1.829 m)    Wt 221 lb 6.4 oz (100.4 kg)   SpO2 97%   BMI 30.03 kg/m   Opioid Risk Score:   Fall Risk Score:  `1  Depression screen [facility] 2/9     04/29/2022    9:10 AM 04/01/2022    8:46 AM 02/28/2022    1:22 PM 01/30/2022    9:05 AM 12/31/2021    8:56 AM 11/29/2021    9:05 AM 10/30/2021    8:18 AM  Depression screen PHQ 2/9  Decreased Interest 0 0 0 0 0 0 0  Down, Depressed, Hopeless 0 0 0 0 0 0 0  PHQ - 2 Score 0 0 0 0 0 0 0     Review of Systems  Constitutional: Negative.   HENT: Negative.    Eyes: Negative.   Respiratory: Negative.    Cardiovascular: Negative.   Gastrointestinal: Negative.   Endocrine: Negative.   Genitourinary: Negative.   Musculoskeletal:  Positive for back pain.  Skin: Negative.   Allergic/Immunologic: Negative.   Neurological: Negative.   Hematological: Negative.   Psychiatric/Behavioral: Negative.    All other systems reviewed and are negative.      Objective:   Physical Exam Vitals and nursing note reviewed.  Constitutional:  Appearance: Normal appearance.  Cardiovascular:     Rate and Rhythm: Normal rate and regular rhythm.     Pulses: Normal pulses.     Heart sounds: Normal heart sounds.  Pulmonary:     Effort: Pulmonary effort is normal.     Breath sounds: Normal breath sounds.  Musculoskeletal:     Cervical back: Normal range of motion and neck supple.     Comments: Normal Muscle Bulk and Muscle Testing Reveals:  Upper Extremities: Full ROM and Muscle Strength 5/5 Lower Extremities: left Lower Extremity with Muscle Waisting Full ROM and Muscle Strength 5/5 Arises from Table with ease Narrow Based  Gait     Skin:    General: Skin is warm and dry.  Neurological:     Mental Status: He is alert and oriented to person, place, and time.  Psychiatric:        Mood and Affect: Mood normal.        Behavior: Behavior normal.         Assessment & Plan:  1. Chronic Left Common Peroneal Nerve Injury: He has Chronic left foot drop, gait  abnormality and chronic neuropathic Pain: Lyrica ineffective he reports. Continue  To Monitor.  04/29/2022 Refilled:  Oxycodone 10 mg one tablet 3 times a day as needed for pain # 90.   We will continue the opioid monitoring program, this consists of regular clinic visits, examinations, urine drug screen, pill counts as well as use of [place] Controlled Substance Reporting system. A 12 month History has been reviewed on the [place] on 04/29/2022. 2. Left Lumbar Radiculitis: No complaints today. Continue HEP as Tolerated. Continue current medication regimen. Continue to Monitor. 04/29/2022 3. Left Shoulder Pain: No complaints today. Continue HEP as Tolerated. Continue to Monitor. 04/29/2022  4. Essential Hypertension: He's not on any anti-hypertensive medication, he states he refused to take the Lisinopril. His PCP following, he was instructed to keep blood pressure log and F/U with his PCP. He verbalizes understanding.   F/U in 1 month

## 2022-05-02 LAB — TOXASSURE SELECT,+ANTIDEPR,UR

## 2022-05-21 ENCOUNTER — Telehealth: Payer: Self-pay | Admitting: *Deleted

## 2022-05-21 NOTE — Telephone Encounter (Signed)
Urine drug screen for this encounter is consistent for prescribed medication. However it is also positive for alcohol. A formal warning letter will be sent through MyChart to patient concerning alcohol while being prescribed narcotics.

## 2022-05-30 ENCOUNTER — Encounter: Payer: Self-pay | Admitting: Registered Nurse

## 2022-05-30 ENCOUNTER — Encounter: Payer: Medicaid Other | Attending: Registered Nurse | Admitting: Registered Nurse

## 2022-05-30 VITALS — BP 131/84 | HR 70 | Ht 72.0 in | Wt 220.0 lb

## 2022-05-30 DIAGNOSIS — Z79899 Other long term (current) drug therapy: Secondary | ICD-10-CM | POA: Diagnosis present

## 2022-05-30 DIAGNOSIS — Z5181 Encounter for therapeutic drug level monitoring: Secondary | ICD-10-CM | POA: Insufficient documentation

## 2022-05-30 DIAGNOSIS — M5416 Radiculopathy, lumbar region: Secondary | ICD-10-CM

## 2022-05-30 DIAGNOSIS — S8412XA Injury of peroneal nerve at lower leg level, left leg, initial encounter: Secondary | ICD-10-CM | POA: Diagnosis present

## 2022-05-30 DIAGNOSIS — M79605 Pain in left leg: Secondary | ICD-10-CM | POA: Insufficient documentation

## 2022-05-30 DIAGNOSIS — G894 Chronic pain syndrome: Secondary | ICD-10-CM | POA: Diagnosis present

## 2022-05-30 MED ORDER — OXYCODONE HCL 10 MG PO TABS: 10.0000 mg | ORAL_TABLET | Freq: Three times a day (TID) | ORAL | 0 refills | Status: DC | PRN

## 2022-05-30 NOTE — Progress Notes (Signed)
Subjective:    Patient ID: Juan [name], male    DOB: [birth year], 57 y.o.   MRN: [id number]  HPI: [name] is a 57 y.o. male who returns for follow up appointment for chronic pain and medication refill. He states his pain is located in his left lower extremity.He  rates his  pain 9. His current exercise regime is walking and performing stretching exercises.  [name] Morphine equivalent is 42.00 MME.   Last UDS was Performed on 04/29/2022, see note for details.    Pain Inventory Average Pain 8 Pain Right Now 9 My pain is sharp and stabbing  In the last 24 hours, has pain interfered with the following? General activity 10 Relation with others 10 Enjoyment of life 10 What TIME of day is your pain at its worst? night Sleep (in general) Fair  Pain is worse with: inactivity and some activites Pain improves with: heat/ice and medication Relief from Meds: 10  History reviewed. No pertinent family history. Social History   Socioeconomic History   Marital status: Divorced    Spouse name: Not on file   Number of children: Not on file   Years of education: Not on file   Highest education level: Not on file  Occupational History   Not on file  Tobacco Use   Smoking status: Never   Smokeless tobacco: Never  Vaping Use   Vaping Use: Never used  Substance and Sexual Activity   Alcohol use: Not Currently   Drug use: Never   Sexual activity: Not on file  Other Topics Concern   Not on file  Social History Narrative   Not on file   Social Determinants of Health   Financial Resource Strain: Not on file  Food Insecurity: Not on file  Transportation Needs: Not on file  Physical Activity: Not on file  Stress: Not on file  Social Connections: Not on file   Past Surgical History:  Procedure Laterality Date   LEG SURGERY     Past Surgical History:  Procedure Laterality Date   LEG SURGERY     Past Medical History:  Diagnosis Date   GERD (gastroesophageal reflux disease)     BP 131/84   Pulse 70   Ht 6' (1.829 m)   Wt 220 lb (99.8 kg)   SpO2 96%   BMI 29.84 kg/m   Opioid Risk Score:   Fall Risk Score:  `1  Depression screen [facility] 2/9     05/30/2022    9:47 AM 04/29/2022    9:10 AM 04/01/2022    8:46 AM 02/28/2022    1:22 PM 01/30/2022    9:05 AM 12/31/2021    8:56 AM 11/29/2021    9:05 AM  Depression screen PHQ 2/9  Decreased Interest 0 0 0 0 0 0 0  Down, Depressed, Hopeless 0 0 0 0 0 0 0  PHQ - 2 Score 0 0 0 0 0 0 0    Review of Systems  Musculoskeletal:  Positive for back pain.       Pain on the left side from shoulder to left leg  All other systems reviewed and are negative.      Objective:   Physical Exam Vitals and nursing note reviewed.  Constitutional:      Appearance: Normal appearance.  Cardiovascular:     Rate and Rhythm: Normal rate and regular rhythm.     Pulses: Normal pulses.     Heart sounds: Normal heart sounds.  Pulmonary:  Effort: Pulmonary effort is normal.     Breath sounds: Normal breath sounds.  Musculoskeletal:     Cervical back: Normal range of motion and neck supple.     Comments: Normal Muscle Bulk and Muscle Testing Reveals:  Upper Extremities: Full ROM and Muscle Strength 5/5  Lower Extremities : Right: Full ROM and Muscle Strength 5/5 Left Lower Extremity: Full ROM and Muscle Waisting noted to Left Lower Extremity Arises from Table with ease Narrow Based Gait     Skin:    General: Skin is warm and dry.  Neurological:     Mental Status: He is alert and oriented to person, place, and time.  Psychiatric:        Mood and Affect: Mood normal.        Behavior: Behavior normal.         Assessment & Plan:  1. Chronic Left Common Peroneal Nerve Injury: He has Chronic left foot drop, gait abnormality and chronic neuropathic Pain: Lyrica ineffective he reports. Continue  To Monitor.  05/30/2022 Refilled:  Oxycodone 10 mg one tablet 3 times a day as needed for pain # 90.   We will continue the  opioid monitoring program, this consists of regular clinic visits, examinations, urine drug screen, pill counts as well as use of [place] Controlled Substance Reporting system. A 12 month History has been reviewed on the [place] Controlled Substance Reporting System on 05/30/2022. 2. Left Lumbar Radiculitis: No complaints today. Continue HEP as Tolerated. Continue current medication regimen. Continue to Monitor. 05/30/2022 3. Left Shoulder Pain: No complaints today. Continue HEP as Tolerated. Continue to Monitor. 05/30/2022   F/U in 1 month

## 2022-06-27 ENCOUNTER — Encounter: Payer: Self-pay | Admitting: Registered Nurse

## 2022-06-27 ENCOUNTER — Encounter: Payer: Medicaid Other | Attending: Registered Nurse | Admitting: Registered Nurse

## 2022-06-27 VITALS — BP 132/78 | HR 60 | Ht 72.0 in | Wt 217.0 lb

## 2022-06-27 DIAGNOSIS — M5416 Radiculopathy, lumbar region: Secondary | ICD-10-CM | POA: Diagnosis not present

## 2022-06-27 DIAGNOSIS — Z5181 Encounter for therapeutic drug level monitoring: Secondary | ICD-10-CM | POA: Diagnosis present

## 2022-06-27 DIAGNOSIS — M79605 Pain in left leg: Secondary | ICD-10-CM | POA: Diagnosis not present

## 2022-06-27 DIAGNOSIS — Z79899 Other long term (current) drug therapy: Secondary | ICD-10-CM | POA: Diagnosis present

## 2022-06-27 DIAGNOSIS — G894 Chronic pain syndrome: Secondary | ICD-10-CM | POA: Diagnosis not present

## 2022-06-27 DIAGNOSIS — S8412XA Injury of peroneal nerve at lower leg level, left leg, initial encounter: Secondary | ICD-10-CM | POA: Diagnosis not present

## 2022-06-27 MED ORDER — OXYCODONE HCL 10 MG PO TABS: 10.0000 mg | ORAL_TABLET | Freq: Three times a day (TID) | ORAL | 0 refills | Status: DC | PRN
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
## 2022-07-26 ENCOUNTER — Encounter: Payer: Self-pay | Admitting: Registered Nurse

## 2022-07-26 ENCOUNTER — Encounter: Payer: Medicaid Other | Attending: Registered Nurse | Admitting: Registered Nurse

## 2022-07-26 VITALS — BP 134/74 | HR 65 | Ht 72.0 in | Wt 222.0 lb

## 2022-07-26 DIAGNOSIS — M79605 Pain in left leg: Secondary | ICD-10-CM | POA: Insufficient documentation

## 2022-07-26 DIAGNOSIS — S8412XA Injury of peroneal nerve at lower leg level, left leg, initial encounter: Secondary | ICD-10-CM | POA: Insufficient documentation

## 2022-07-26 DIAGNOSIS — Z79899 Other long term (current) drug therapy: Secondary | ICD-10-CM | POA: Insufficient documentation

## 2022-07-26 DIAGNOSIS — G894 Chronic pain syndrome: Secondary | ICD-10-CM | POA: Insufficient documentation

## 2022-07-26 DIAGNOSIS — Z5181 Encounter for therapeutic drug level monitoring: Secondary | ICD-10-CM | POA: Insufficient documentation

## 2022-07-26 DIAGNOSIS — M5416 Radiculopathy, lumbar region: Secondary | ICD-10-CM | POA: Insufficient documentation

## 2022-07-26 MED ORDER — OXYCODONE HCL 10 MG PO TABS: 10.0000 mg | ORAL_TABLET | Freq: Three times a day (TID) | ORAL | 0 refills | Status: DC | PRN

## 2022-07-26 NOTE — Progress Notes (Signed)
Subjective:    Patient ID: Juan [name], male    DOB: 1965/04/02, 57 y.o.   MRN: [id number]  HPI: Juan [name] is a 57 y.o. male who returns for follow up appointment for chronic pain and medication refill. She states her pain is located in her lower back radiating into his left lower extremity and left lower extremity pain. She rates her pain 3. Her current exercise regime is walking and performing stretching exercises.  Juan [name] Morphine equivalent is 45.00 MME.   Last UDS was Performed on 04/29/2022, it was consistent.     Pain Inventory Average Pain 9 Pain Right Now 3 My pain is intermittent, sharp, and stabbing  In the last 24 hours, has pain interfered with the following? General activity 10 Relation with others 1 Enjoyment of life 10 What TIME of day is your pain at its worst? night Sleep (in general) Fair  Pain is worse with: inactivity Pain improves with: medication Relief from Meds: 10  No family history on file. Social History   Socioeconomic History   Marital status: Divorced    Spouse name: Not on file   Number of children: Not on file   Years of education: Not on file   Highest education level: Not on file  Occupational History   Not on file  Tobacco Use   Smoking status: Never   Smokeless tobacco: Never  Vaping Use   Vaping Use: Never used  Substance and Sexual Activity   Alcohol use: Not Currently   Drug use: Never   Sexual activity: Not on file  Other Topics Concern   Not on file  Social History Narrative   Not on file   Social Determinants of Health   Financial Resource Strain: Not on file  Food Insecurity: Not on file  Transportation Needs: Not on file  Physical Activity: Not on file  Stress: Not on file  Social Connections: Not on file   Past Surgical History:  Procedure Laterality Date   LEG SURGERY     Past Surgical History:  Procedure Laterality Date   LEG SURGERY     Past Medical History:  Diagnosis Date   GERD  (gastroesophageal reflux disease)    There were no vitals taken for this visit.  Opioid Risk Score:   Fall Risk Score:  `1  Depression screen [place] 2/9     05/30/2022    9:47 AM 04/29/2022    9:10 AM 04/01/2022    8:46 AM 02/28/2022    1:22 PM 01/30/2022    9:05 AM 12/31/2021    8:56 AM 11/29/2021    9:05 AM  Depression screen PHQ 2/9  Decreased Interest 0 0 0 0 0 0 0  Down, Depressed, Hopeless 0 0 0 0 0 0 0  PHQ - 2 Score 0 0 0 0 0 0 0     Review of Systems  Musculoskeletal:  Positive for back pain.       LT side  All other systems reviewed and are negative.      Objective:   Physical Exam Vitals and nursing note reviewed.  Constitutional:      Appearance: Normal appearance.  Cardiovascular:     Rate and Rhythm: Normal rate and regular rhythm.     Pulses: Normal pulses.     Heart sounds: Normal heart sounds.  Pulmonary:     Effort: Pulmonary effort is normal.     Breath sounds: Normal breath sounds.  Musculoskeletal:     Cervical  back: Normal range of motion and neck supple.     Comments: Normal Muscle Bulk and Muscle Testing Reveals:  Upper Extremities: Full ROM and Muscle Strength 5/5   Lower Extremities: Full ROM and Muscle Strength 5/5 Arises from chair with ease  Narrow Based  Gait     Skin:    General: Skin is warm and dry.  Neurological:     Mental Status: He is alert and oriented to person, place, and time.  Psychiatric:        Mood and Affect: Mood normal.        Behavior: Behavior normal.         Assessment & Plan:  1. Chronic Left Common Peroneal Nerve Injury: He has Chronic left foot drop, gait abnormality and chronic neuropathic Pain: Lyrica ineffective he reports. Continue  To Monitor.  07/26/2022 Refilled:  Oxycodone 10 mg one tablet 3 times a day as needed for pain # 90.   We will continue the opioid monitoring program, this consists of regular clinic visits, examinations, urine drug screen, pill counts as well as use of [place]  Controlled Substance Reporting system. A 12 month History has been reviewed on the [place] Controlled Substance Reporting System on 07/26/2022. 2. Left Lumbar Radiculitis: . Continue HEP as Tolerated. Continue current medication regimen. Continue to Monitor. 07/26/2022 3. Left Shoulder Pain: No complaints today. Continue HEP as Tolerated. Continue to Monitor. 07/26/2022   F/U in 1 month

## 2022-08-26 ENCOUNTER — Encounter: Payer: Self-pay | Admitting: Registered Nurse

## 2022-08-26 ENCOUNTER — Encounter: Payer: Medicaid Other | Attending: Registered Nurse | Admitting: Registered Nurse

## 2022-08-26 VITALS — BP 125/81 | HR 67 | Ht 72.0 in | Wt 218.0 lb

## 2022-08-26 DIAGNOSIS — M79605 Pain in left leg: Secondary | ICD-10-CM | POA: Diagnosis present

## 2022-08-26 DIAGNOSIS — Z79899 Other long term (current) drug therapy: Secondary | ICD-10-CM

## 2022-08-26 DIAGNOSIS — Z5181 Encounter for therapeutic drug level monitoring: Secondary | ICD-10-CM | POA: Diagnosis present

## 2022-08-26 DIAGNOSIS — M5416 Radiculopathy, lumbar region: Secondary | ICD-10-CM | POA: Diagnosis present

## 2022-08-26 DIAGNOSIS — S8412XA Injury of peroneal nerve at lower leg level, left leg, initial encounter: Secondary | ICD-10-CM | POA: Diagnosis present

## 2022-08-26 DIAGNOSIS — G894 Chronic pain syndrome: Secondary | ICD-10-CM

## 2022-08-26 MED ORDER — OXYCODONE HCL 10 MG PO TABS: 10.0000 mg | ORAL_TABLET | Freq: Three times a day (TID) | ORAL | 0 refills | Status: DC | PRN

## 2022-08-26 NOTE — Progress Notes (Signed)
Subjective:    Patient ID: Juan [name], male    DOB: Jan 22, 1966, 57 y.o.   MRN: [id number]  HPI: Juan [name] is a 57 y.o. male who returns for follow up appointment for chronic pain and medication refill. He states his pain is located in his lower back pain radiating into his left lower extremity. He rates his pain 4. His current exercise regime is walking and performing stretching exercises.  Juan [name] equivalent is 45.00 MME.   Last UDS was Performed on 04/29/2022, it was consistent.     Pain Inventory Average Pain 10 Pain Right Now 4 My pain is constant, sharp, and aching  In the last 24 hours, has pain interfered with the following? General activity 4 Relation with others 4 Enjoyment of life 10 What TIME of day is your pain at its worst? night Sleep (in general) Fair  Pain is worse with: inactivity Pain improves with: medication Relief from Meds: 10  No family history on file. Social History   Socioeconomic History   Marital status: Divorced    Spouse name: Not on file   Number of children: Not on file   Years of education: Not on file   Highest education level: Not on file  Occupational History   Not on file  Tobacco Use   Smoking status: Never   Smokeless tobacco: Never  Vaping Use   Vaping Use: Never used  Substance and Sexual Activity   Alcohol use: Not Currently   Drug use: Never   Sexual activity: Not on file  Other Topics Concern   Not on file  Social History Narrative   Not on file   Social Determinants of Health   Financial Resource Strain: Not on file  Food Insecurity: Not on file  Transportation Needs: Not on file  Physical Activity: Not on file  Stress: Not on file  Social Connections: Not on file   Past Surgical History:  Procedure Laterality Date   LEG SURGERY     Past Surgical History:  Procedure Laterality Date   LEG SURGERY     Past Medical History:  Diagnosis Date   GERD (gastroesophageal reflux disease)    There  were no vitals taken for this visit.  Opioid Risk Score:   Fall Risk Score:  `1  Depression screen [facility] 2/9     07/26/2022    9:26 AM 05/30/2022    9:47 AM 04/29/2022    9:10 AM 04/01/2022    8:46 AM 02/28/2022    1:22 PM 01/30/2022    9:05 AM 12/31/2021    8:56 AM  Depression screen PHQ 2/9  Decreased Interest 0 0 0 0 0 0 0  Down, Depressed, Hopeless 0 0 0 0 0 0 0  PHQ - 2 Score 0 0 0 0 0 0 0     Review of Systems  Musculoskeletal:        LT shoulder, arm foot pain  All other systems reviewed and are negative.      Objective:   Physical Exam Vitals and nursing note reviewed.  Constitutional:      Appearance: Normal appearance.  Cardiovascular:     Rate and Rhythm: Normal rate and regular rhythm.     Pulses: Normal pulses.     Heart sounds: Normal heart sounds.  Pulmonary:     Effort: Pulmonary effort is normal.     Breath sounds: Normal breath sounds.  Musculoskeletal:     Cervical back: Normal range of motion  and neck supple.     Comments: Normal Muscle Bulk and Muscle Testing Reveals:  Upper Extremities: Full ROM and Muscle Strength 5/5 Lower Extremities Right: Full ROM and Muscle Strength 5/5 Left Lower Extremity: Full ROM and Muscle Waisting Noted  Arises from chair with ease Narrow Based Gait     Skin:    General: Skin is warm and dry.  Neurological:     Mental Status: He is alert and oriented to person, place, and time.  Psychiatric:        Mood and Affect: Mood normal.        Behavior: Behavior normal.         Assessment & Plan:  1. Chronic Left Common Peroneal Nerve Injury: He has Chronic left foot drop, gait abnormality and chronic neuropathic Pain: Lyrica ineffective he reports. Continue  To Monitor.  08/26/2022 Refilled:  Oxycodone 10 mg one tablet 3 times a day as needed for pain # 90.   We will continue the opioid monitoring program, this consists of regular clinic visits, examinations, urine drug screen, pill counts as well as use of [place] Controlled Substance Reporting system. A 12 month History has been reviewed on the [place] Controlled Substance Reporting System on 08/26/2022. 2. Left Lumbar Radiculitis: . Continue HEP as Tolerated. Continue current medication regimen. Continue to Monitor. 08/26/2022 3. Left Shoulder Pain: No complaints today. Continue HEP as Tolerated. Continue to Monitor. 08/26/2022   F/U in 2 months: Juan [name] has several personal appointments to attend to, will allow him to return in 2 months.

## 2022-09-26 ENCOUNTER — Telehealth: Payer: Self-pay | Admitting: *Deleted

## 2022-09-26 ENCOUNTER — Telehealth: Payer: Self-pay

## 2022-09-26 NOTE — Telephone Encounter (Signed)
Patient called stating need Oxycodone called in for him. Next appt 10/29/22

## 2022-09-26 NOTE — Telephone Encounter (Signed)
PA initiated. [name] (KeyBrett Albino) Rx #: [id number]

## 2022-09-26 NOTE — Telephone Encounter (Signed)
Medication list was reviewed.  Juan [name] prescription was already sent to pharmacy with a do not fill date. Call placed to Juan [name], operator stated unable to complete the call at this time. When he returns the call, please let him know to call the pharmacy.

## 2022-09-27 NOTE — Telephone Encounter (Signed)
Juan [name] (Juan [name]) - [id number] oxyCODONE HCl 10MG  tablets Status: PA Response - Approved

## 2022-10-29 ENCOUNTER — Encounter: Payer: Self-pay | Admitting: Registered Nurse

## 2022-10-29 ENCOUNTER — Encounter: Payer: Medicaid Other | Attending: Registered Nurse | Admitting: Registered Nurse

## 2022-10-29 VITALS — BP 107/68 | HR 73 | Ht 72.0 in | Wt 218.6 lb

## 2022-10-29 DIAGNOSIS — S8412XA Injury of peroneal nerve at lower leg level, left leg, initial encounter: Secondary | ICD-10-CM | POA: Diagnosis not present

## 2022-10-29 DIAGNOSIS — Z5181 Encounter for therapeutic drug level monitoring: Secondary | ICD-10-CM | POA: Insufficient documentation

## 2022-10-29 DIAGNOSIS — M79605 Pain in left leg: Secondary | ICD-10-CM | POA: Insufficient documentation

## 2022-10-29 DIAGNOSIS — Z79899 Other long term (current) drug therapy: Secondary | ICD-10-CM | POA: Diagnosis present

## 2022-10-29 DIAGNOSIS — G894 Chronic pain syndrome: Secondary | ICD-10-CM | POA: Diagnosis not present

## 2022-10-29 MED ORDER — OXYCODONE HCL 10 MG PO TABS: 10.0000 mg | ORAL_TABLET | Freq: Three times a day (TID) | ORAL | 0 refills | Status: DC | PRN

## 2022-10-29 NOTE — Progress Notes (Signed)
Subjective:    Patient ID: Juan [name], male    DOB: 14-Sep-1965, 57 y.o.   MRN: [id number]  HPI: Juan [name] is a 57 y.o. male who returns for follow up appointment for chronic pain and medication refill. He states his pain is located in his left lower extremity. He rates his pain 8. His current exercise regime is walking and performing stretching exercises.  Ms. Furtado Morphine equivalent is 45.00 MME.   UDS ordered today.     Pain Inventory Average Pain 9 Pain Right Now 8 My pain is sharp and stabbing  In the last 24 hours, has pain interfered with the following? General activity 4 Relation with others 4 Enjoyment of life 4 What TIME of day is your pain at its worst? night Sleep (in general) Fair  Pain is worse with: inactivity Pain improves with: medication Relief from Meds: 10  No family history on file. Social History   Socioeconomic History   Marital status: Divorced    Spouse name: Not on file   Number of children: Not on file   Years of education: Not on file   Highest education level: Not on file  Occupational History   Not on file  Tobacco Use   Smoking status: Never   Smokeless tobacco: Never  Vaping Use   Vaping status: Never Used  Substance and Sexual Activity   Alcohol use: Not Currently   Drug use: Never   Sexual activity: Not on file  Other Topics Concern   Not on file  Social History Narrative   Not on file   Social Determinants of Health   Financial Resource Strain: Not on file  Food Insecurity: No Food Insecurity (09/23/2022)   Received from [facility]   Hunger Vital Sign    Worried About Running Out of Food in the Last Year: Never true    Ran Out of Food in the Last Year: Never true  Transportation Needs: Not on file  Physical Activity: Not on file  Stress: No Stress Concern Present (09/23/2022)   Received from [facility] - Occupational Stress Questionnaire     Feeling of Stress : Not at all  Social Connections: Not on file   Past Surgical History:  Procedure Laterality Date   LEG SURGERY     Past Surgical History:  Procedure Laterality Date   LEG SURGERY     Past Medical History:  Diagnosis Date   GERD (gastroesophageal reflux disease)    BP 107/68   Pulse 73   Ht 6' (1.829 m)   Wt 218 lb 9.6 oz (99.2 kg)   SpO2 97%   BMI 29.65 kg/m   Opioid Risk Score:   Fall Risk Score:  `1  Depression screen [facility] 2/9     10/29/2022    8:58 AM 08/26/2022    9:03 AM 07/26/2022    9:26 AM 05/30/2022    9:47 AM 04/29/2022    9:10 AM 04/01/2022    8:46 AM 02/28/2022    1:22 PM  Depression screen PHQ 2/9  Decreased Interest 0 0 0 0 0 0 0  Down, Depressed, Hopeless 0 0 0 0 0 0 0  PHQ - 2 Score 0 0 0 0 0 0 0     Review of Systems  Constitutional: Negative.   HENT: Negative.    Eyes: Negative.   Respiratory: Negative.    Cardiovascular: Negative.   Gastrointestinal: Negative.  Endocrine: Negative.   Genitourinary: Negative.   Musculoskeletal:  Positive for back pain.  Skin: Negative.   Allergic/Immunologic: Negative.   Neurological: Negative.   Hematological: Negative.   Psychiatric/Behavioral: Negative.    All other systems reviewed and are negative.      Objective:   Physical Exam Vitals and nursing note reviewed.  Constitutional:      Appearance: Normal appearance.  Cardiovascular:     Rate and Rhythm: Normal rate and regular rhythm.     Pulses: Normal pulses.     Heart sounds: Normal heart sounds.  Pulmonary:     Effort: Pulmonary effort is normal.     Breath sounds: Normal breath sounds.  Musculoskeletal:     Cervical back: Normal range of motion and neck supple.     Comments: Normal Muscle Bulk and Muscle Testing Reveals:  Upper Extremities: Full ROM and Muscle Strength 5/5  Lumbar Paraspinal Tenderness: L-4-L-5 Lower Extremities: Full ROM and Muscle Strength 5/5 Arises from Table with ease Narrow Based  Gait      Skin:    General: Skin is warm and dry.  Neurological:     Mental Status: He is alert and oriented to person, place, and time.  Psychiatric:        Mood and Affect: Mood normal.        Behavior: Behavior normal.         Assessment & Plan:  1. Chronic Left Common Peroneal Nerve Injury: He has Chronic left foot drop, gait abnormality and chronic neuropathic Pain: Lyrica ineffective he reports. Continue  To Monitor.  10/29/2022 Refilled:  Oxycodone 10 mg one tablet 3 times a day as needed for pain # 90.   We will continue the opioid monitoring program, this consists of regular clinic visits, examinations, urine drug screen, pill counts as well as use of [place] Controlled Substance Reporting system. A 12 month History has been reviewed on the [place] Controlled Substance Reporting System on 10/29/2022. 2. Left Lumbar Radiculitis: . Continue HEP as Tolerated. Continue current medication regimen. Continue to Monitor. 10/29/2022 3. Left Shoulder Pain: No complaints today. Continue HEP as Tolerated. Continue to Monitor. 10/29/2022   F/U in 1 month

## 2022-11-05 LAB — TOXASSURE SELECT,+ANTIDEPR,UR

## 2022-11-07 ENCOUNTER — Telehealth: Payer: Self-pay | Admitting: *Deleted

## 2022-11-07 NOTE — Telephone Encounter (Signed)
Urine drug screen for this encounter is consistent for prescribed medication. However, it is once again positive for alcohol. He has had one warning in April, and this will be the FINAL warning. The final warning is being send through [facility] and [employer].

## 2022-11-27 NOTE — Progress Notes (Unsigned)
Subjective:    Patient ID: Juan [name], male    DOB: 13-Apr-1965, 57 y.o.   MRN: [id number]  HPI: Juan [name] is a 57 y.o. male who returns for follow up appointment for chronic pain and medication refill. He states his pain is located in his lower back radiating into her left lower extremity. He rates his pain 3. His current exercise regime is walking and performing stretching exercises.  Juan [name] is 45.00 MME.   Last UDS was POerformed on 10/29/2022, it was +ETOH, he was educated on the narcotic Policy, he received a Final Written Warning letter, he verbalizes understanding.      Pain Inventory Average Pain 8 Pain Right Now 3 My pain is constant, sharp, and aching  In the last 24 hours, has pain interfered with the following? General activity 10 Relation with others 10 Enjoyment of life 10 What TIME of day is your pain at its worst? night Sleep (in general) Good  Pain is worse with: walking, bending, sitting, standing, and some activites Pain improves with: medication Relief from Meds: 10  No family history on file. Social History   Socioeconomic History   Marital status: Divorced    Spouse name: Not on file   Number of children: Not on file   Years of education: Not on file   Highest education level: Not on file  Occupational History   Not on file  Tobacco Use   Smoking status: Never   Smokeless tobacco: Never  Vaping Use   Vaping status: Never Used  Substance and Sexual Activity   Alcohol use: Not Currently   Drug use: Never   Sexual activity: Not on file  Other Topics Concern   Not on file  Social History Narrative   Not on file   Social Determinants of Health   Financial Resource Strain: Not on file  Food Insecurity: No Food Insecurity (09/23/2022)   Received from [facility]   Hunger Vital Sign    Worried About Running Out of Food in the Last Year: Never true    Ran Out of Food in the Last Year: Never true   Transportation Needs: Not on file  Physical Activity: Not on file  Stress: No Stress Concern Present (09/23/2022)   Received from [facility] - Occupational Stress Questionnaire    Feeling of Stress : Not at all  Social Connections: Not on file   Past Surgical History:  Procedure Laterality Date   LEG SURGERY     Past Surgical History:  Procedure Laterality Date   LEG SURGERY     Past Medical History:  Diagnosis Date   GERD (gastroesophageal reflux disease)    There were no vitals taken for this visit.  Opioid Risk Score:   Fall Risk Score:  `1  Depression screen [facility] 2/9     10/29/2022    8:58 AM 08/26/2022    9:03 AM 07/26/2022    9:26 AM 05/30/2022    9:47 AM 04/29/2022    9:10 AM 04/01/2022    8:46 AM 02/28/2022    1:22 PM  Depression screen PHQ 2/9  Decreased Interest 0 0 0 0 0 0 0  Down, Depressed, Hopeless 0 0 0 0 0 0 0  PHQ - 2 Score 0 0 0 0 0 0 0    Review of Systems  Musculoskeletal:  Positive for back pain and gait problem.  Pain on the right side: shoulder, leg, arm   All other systems reviewed and are negative.      Objective:   Physical Exam Vitals and nursing note reviewed.  Constitutional:      Appearance: Normal appearance.  Cardiovascular:     Rate and Rhythm: Normal rate and regular rhythm.     Pulses: Normal pulses.     Heart sounds: Normal heart sounds.  Pulmonary:     Effort: Pulmonary effort is normal.     Breath sounds: Normal breath sounds.  Musculoskeletal:     Cervical back: Normal range of motion and neck supple.     Comments: Normal Muscle Bulk and Muscle Testing Reveals:  Upper Extremities: Full ROM and Muscle Strength 5/5  Lumbar Paraspinal Tenderness: L-4-L-5 Lower Extremities: Full ROM and Muscle Strength 5/5 Arises from Table with ease Narrow Based  Gait     Skin:    General: Skin is warm and dry.  Neurological:     Mental Status: He is alert and oriented  to person, place, and time.  Psychiatric:        Mood and Affect: Mood normal.        Behavior: Behavior normal.         Assessment & Plan:  1. Chronic Left Common Peroneal Nerve Injury: He has Chronic left foot drop, gait abnormality and chronic neuropathic Pain: Lyrica ineffective he reports. Continue  To Monitor.  11/28/2022 Refilled:  Oxycodone 10 mg one tablet 3 times a day as needed for pain # 90.   We will continue the opioid monitoring program, this consists of regular clinic visits, examinations, urine drug screen, pill counts as well as use of [place] Controlled Substance Reporting system. A 12 month History has been reviewed on the [place] Controlled Substance Reporting System on 11/28/2022. 2. Left Lumbar Radiculitis: . Continue HEP as Tolerated. Continue current medication regimen. Continue to Monitor. 11/28/2022 3. Left Shoulder Pain: No complaints today. Continue HEP as Tolerated. Continue to Monitor. 11/28/2022   F/U in 1 month

## 2022-11-28 ENCOUNTER — Encounter: Payer: Medicaid Other | Attending: Registered Nurse | Admitting: Registered Nurse

## 2022-11-28 ENCOUNTER — Encounter: Payer: Self-pay | Admitting: Registered Nurse

## 2022-11-28 VITALS — BP 132/82 | HR 66 | Ht 72.0 in | Wt 217.0 lb

## 2022-11-28 DIAGNOSIS — Z79899 Other long term (current) drug therapy: Secondary | ICD-10-CM | POA: Diagnosis present

## 2022-11-28 DIAGNOSIS — Z5181 Encounter for therapeutic drug level monitoring: Secondary | ICD-10-CM | POA: Insufficient documentation

## 2022-11-28 DIAGNOSIS — S8412XA Injury of peroneal nerve at lower leg level, left leg, initial encounter: Secondary | ICD-10-CM | POA: Insufficient documentation

## 2022-11-28 DIAGNOSIS — G894 Chronic pain syndrome: Secondary | ICD-10-CM | POA: Diagnosis present

## 2022-11-28 DIAGNOSIS — M5416 Radiculopathy, lumbar region: Secondary | ICD-10-CM | POA: Insufficient documentation

## 2022-11-28 MED ORDER — OXYCODONE HCL 10 MG PO TABS: 10.0000 mg | ORAL_TABLET | Freq: Three times a day (TID) | ORAL | 0 refills | Status: DC | PRN

## 2022-12-30 ENCOUNTER — Encounter: Payer: Self-pay | Admitting: Registered Nurse

## 2022-12-30 ENCOUNTER — Encounter: Payer: Medicaid Other | Attending: Registered Nurse | Admitting: Registered Nurse

## 2022-12-30 VITALS — Ht 72.0 in | Wt 209.0 lb

## 2022-12-30 DIAGNOSIS — S8412XA Injury of peroneal nerve at lower leg level, left leg, initial encounter: Secondary | ICD-10-CM | POA: Diagnosis not present

## 2022-12-30 DIAGNOSIS — G894 Chronic pain syndrome: Secondary | ICD-10-CM | POA: Diagnosis not present

## 2022-12-30 DIAGNOSIS — M5416 Radiculopathy, lumbar region: Secondary | ICD-10-CM

## 2022-12-30 DIAGNOSIS — Z5181 Encounter for therapeutic drug level monitoring: Secondary | ICD-10-CM

## 2022-12-30 DIAGNOSIS — Z79899 Other long term (current) drug therapy: Secondary | ICD-10-CM

## 2022-12-30 MED ORDER — OXYCODONE HCL 10 MG PO TABS: 10.0000 mg | ORAL_TABLET | Freq: Three times a day (TID) | ORAL | 0 refills | Status: DC | PRN

## 2022-12-30 NOTE — Progress Notes (Signed)
Subjective:    Patient ID: Juan [name], male    DOB: [birth year], 57 y.o.   MRN: [id number]  HPI: Juan [name] is a 57 y.o. male who called office reporting he had a COVID exposure, his appointment was changed to Virtual visit. I connected with Mr. Juan [name]  by a video enabled telemedicine application and verified that I am speaking with the correct person using two identifiers.  Location: Patient: In his Home Provider: In the Office    I discussed the limitations of evaluation and management by telemedicine and the availability of in person appointments. The patient expressed understanding and agreed to proceed.  He states his pain is located in his lower back radiating into his left lower extremity. He  rates his pain 2. His current exercise regime is walking and performing stretching exercises.  Mr. [name] has family plans with his daughter, he will be allowed to F/U in 2 months. He verbalizes understanding.   Mr. [name] Morphine equivalent is 45.00 MME.   Last UDS was Performed on 10/29/2022, see note for details.     Pain Inventory Average Pain 2 Pain Right Now 2 My pain is intermittent and stabbing  In the last 24 hours, has pain interfered with the following? General activity 0 Relation with others 0 Enjoyment of life 0 What TIME of day is your pain at its worst? night Sleep (in general) Good  Pain is worse with: walking, bending, sitting, standing, and some activites Pain improves with: rest and medication Relief from Meds: 5  No family history on file. Social History   Socioeconomic History   Marital status: Divorced    Spouse name: Not on file   Number of children: Not on file   Years of education: Not on file   Highest education level: Not on file  Occupational History   Not on file  Tobacco Use   Smoking status: Never   Smokeless tobacco: Never  Vaping Use   Vaping status: Never Used  Substance and Sexual Activity   Alcohol use: Not Currently   Drug  use: Never   Sexual activity: Not on file  Other Topics Concern   Not on file  Social History Narrative   Not on file   Social Determinants of Health   Financial Resource Strain: Not on file  Food Insecurity: No Food Insecurity (09/23/2022)   Received from [facility]   Hunger Vital Sign    Worried About Running Out of Food in the Last Year: Never true    Ran Out of Food in the Last Year: Never true  Transportation Needs: Not on file  Physical Activity: Not on file  Stress: No Stress Concern Present (09/23/2022)   Received from [facility] - Occupational Stress Questionnaire    Feeling of Stress : Not at all  Social Connections: Not on file   Past Surgical History:  Procedure Laterality Date   LEG SURGERY     Past Surgical History:  Procedure Laterality Date   LEG SURGERY     Past Medical History:  Diagnosis Date   GERD (gastroesophageal reflux disease)    There were no vitals taken for this visit.  Opioid Risk Score:   Fall Risk Score:  `1  Depression screen [facility] 2/9     11/28/2022    9:23 AM 10/29/2022    8:58 AM 08/26/2022    9:03 AM 07/26/2022    9:26 AM  05/30/2022    9:47 AM 04/29/2022    9:10 AM 04/01/2022    8:46 AM  Depression screen PHQ 2/9  Decreased Interest 0 0 0 0 0 0 0  Down, Depressed, Hopeless 0 0 0 0 0 0 0  PHQ - 2 Score 0 0 0 0 0 0 0     Review of Systems  Musculoskeletal:  Positive for back pain and gait problem.  All other systems reviewed and are negative.     Objective:   Physical Exam Vitals and nursing note reviewed.  Musculoskeletal:     Comments: No Physical Exam: Virtual Visit         Assessment & Plan:  1. Chronic Left Common Peroneal Nerve Injury: He has Chronic left foot drop, gait abnormality and chronic neuropathic Pain: Lyrica ineffective he reports. Continue  To Monitor.  12/30/2022 Refilled:  Oxycodone 10 mg one tablet 3 times a day as needed  for pain # 90.  Second script sent for the following month.  We will continue the opioid monitoring program, this consists of regular clinic visits, examinations, urine drug screen, pill counts as well as use of [place] Controlled Substance Reporting system. A 12 month History has been reviewed on the [place] Controlled Substance Reporting System on 12/30/2022. 2. Left Lumbar Radiculitis: . Continue HEP as Tolerated. Continue current medication regimen. Continue to Monitor. 12/30/2022 3. Left Shoulder Pain: No complaints today. Continue HEP as Tolerated. Continue to Monitor. 12/30/2022   F/U in 2 months

## 2023-01-28 ENCOUNTER — Ambulatory Visit: Payer: Medicaid Other | Admitting: Registered Nurse

## 2023-02-21 NOTE — Progress Notes (Signed)
 Subjective:    Patient ID: Juan [name], male    DOB: [birth year], 58 y.o.   MRN: [id number]  HPI: Juan [name] is a 58 y.o. male who returns for follow up appointment for chronic pain and medication refill. He states his pain is located in his lower back radiating into her left lower extremity. He rates his pain 2. His current exercise regime is walking and performing stretching exercises.  Ms. Meir Morphine equivalent is 45.00 MME.   UDS ordered today.      Pain Inventory Average Pain 8 Pain Right Now 2 My pain is intermittent, sharp, burning, dull, stabbing, tingling, and aching  In the last 24 hours, has pain interfered with the following? General activity 0 Relation with others 0 Enjoyment of life 0 What TIME of day is your pain at its worst? evening Sleep (in general) Good  Pain is worse with: inactivity Pain improves with: pacing activities, medication, and heat Relief from Meds: 10  No family history on file. Social History   Socioeconomic History   Marital status: Divorced    Spouse name: Not on file   Number of children: Not on file   Years of education: Not on file   Highest education level: Not on file  Occupational History   Not on file  Tobacco Use   Smoking status: Never   Smokeless tobacco: Never  Vaping Use   Vaping status: Never Used  Substance and Sexual Activity   Alcohol use: Not Currently   Drug use: Never   Sexual activity: Not on file  Other Topics Concern   Not on file  Social History Narrative   Not on file   Social Drivers of Health   Financial Resource Strain: Not on file  Food Insecurity: No Food Insecurity (09/23/2022)   Received from [facility]   Hunger Vital Sign    Worried About Running Out of Food in the Last Year: Never true    Ran Out of Food in the Last Year: Never true  Transportation Needs: Not on file  Physical Activity: Not on file  Stress: No Stress Concern Present (09/23/2022)   Received from [facility] Occupational Health - Occupational Stress Questionnaire    Feeling of Stress : Not at all  Social Connections: Not on file   Past Surgical History:  Procedure Laterality Date   LEG SURGERY     Past Surgical History:  Procedure Laterality Date   LEG SURGERY     Past Medical History:  Diagnosis Date   GERD (gastroesophageal reflux disease)    There were no vitals taken for this visit.  Opioid Risk Score:   Fall Risk Score:  `1  Depression screen [facility] 2/9     12/30/2022   12:51 PM 11/28/2022    9:23 AM 10/29/2022    8:58 AM 08/26/2022    9:03 AM 07/26/2022    9:26 AM 05/30/2022    9:47 AM 04/29/2022    9:10 AM  Depression screen PHQ 2/9  Decreased Interest 0 0 0 0 0 0 0  Down, Depressed, Hopeless 0 0 0 0 0 0 0  PHQ - 2 Score 0 0 0 0 0 0 0    Review of Systems  Musculoskeletal:        Pain in the left leg down to left foot  All other systems reviewed and are negative.      Objective:   Physical Exam  Vitals and nursing note reviewed.  Constitutional:      Appearance: Normal appearance.  Cardiovascular:     Rate and Rhythm: Normal rate and regular rhythm.     Pulses: Normal pulses.     Heart sounds: Normal heart sounds.  Pulmonary:     Effort: Pulmonary effort is normal.     Breath sounds: Normal breath sounds.  Musculoskeletal:     Comments: Normal Muscle Bulk and Muscle Testing Reveals:  Upper Extremities: Full ROM and Muscle Strength 5/5 Lower Extremities: Full ROM and Muscle Strength 5/5 Arises from Table with ease Narrow Based  Gait     Skin:    General: Skin is warm and dry.  Neurological:     Mental Status: He is alert and oriented to person, place, and time.  Psychiatric:        Mood and Affect: Mood normal.        Behavior: Behavior normal.         Assessment & Plan:  1. Chronic Left Common Peroneal Nerve Injury: He has Chronic left foot drop, gait abnormality and chronic neuropathic Pain: Lyrica   ineffective he reports. Continue  To Monitor. 02/24/2023 Refilled:  Oxycodone  10 mg one tablet 3 times a day as needed for pain # 90.  Second script sent for the following month.  We will continue the opioid monitoring program, this consists of regular clinic visits, examinations, urine drug screen, pill counts as well as use of [place]  Controlled Substance Reporting system. A 12 month History has been reviewed on the [place]  Controlled Substance Reporting System on 02/24/2023. 2. Left Lumbar Radiculitis: . Continue HEP as Tolerated. Continue current medication regimen. Continue to Monitor. 02/24/2023 3. Left Shoulder Pain: No complaints today. Continue HEP as Tolerated. Continue to Monitor. 11/24/2023   F/U in 1 month

## 2023-02-24 ENCOUNTER — Encounter: Payer: Self-pay | Admitting: Registered Nurse

## 2023-02-24 ENCOUNTER — Encounter: Payer: Medicaid Other | Attending: Registered Nurse | Admitting: Registered Nurse

## 2023-02-24 VITALS — BP 138/81 | HR 65 | Ht 72.0 in | Wt 221.0 lb

## 2023-02-24 DIAGNOSIS — M5416 Radiculopathy, lumbar region: Secondary | ICD-10-CM | POA: Insufficient documentation

## 2023-02-24 DIAGNOSIS — G894 Chronic pain syndrome: Secondary | ICD-10-CM | POA: Diagnosis present

## 2023-02-24 DIAGNOSIS — S8412XA Injury of peroneal nerve at lower leg level, left leg, initial encounter: Secondary | ICD-10-CM | POA: Insufficient documentation

## 2023-02-24 DIAGNOSIS — Z5181 Encounter for therapeutic drug level monitoring: Secondary | ICD-10-CM | POA: Diagnosis present

## 2023-02-24 DIAGNOSIS — Z79899 Other long term (current) drug therapy: Secondary | ICD-10-CM | POA: Diagnosis not present

## 2023-02-24 MED ORDER — OXYCODONE HCL 10 MG PO TABS: 10.0000 mg | ORAL_TABLET | Freq: Three times a day (TID) | ORAL | 0 refills | Status: AC | PRN

## 2023-02-26 LAB — TOXASSURE SELECT,+ANTIDEPR,UR

## 2023-02-27 ENCOUNTER — Telehealth: Payer: Self-pay | Admitting: Registered Nurse

## 2023-02-27 NOTE — Telephone Encounter (Signed)
 UDS Results was Reviewed.  + ETOH, he had a final warning in September.  Spoke with Dr Carilyn regarding the above and he agrees with the discharge.  Call placed to Mr. Domine  regarding the above, he is aware he is being discharge. A letter will be sent , he verbalizes understanding.

## 2023-03-05 NOTE — Telephone Encounter (Signed)
 Discharge letter sent through [facility] and [employer].

## 2023-03-27 ENCOUNTER — Ambulatory Visit: Payer: Medicaid Other | Admitting: Registered Nurse

## 2023-04-24 ENCOUNTER — Ambulatory Visit: Payer: Medicaid Other | Admitting: Registered Nurse

## 2024-03-24 ENCOUNTER — Encounter: Admission: RE | Payer: Self-pay | Source: Home / Self Care

## 2024-03-24 ENCOUNTER — Ambulatory Visit: Admission: RE | Admit: 2024-03-24 | Source: Home / Self Care | Admitting: Gastroenterology
